# Patient Record
Sex: Male | Born: 2000 | Race: Black or African American | Hispanic: No | Marital: Single | Smoking: Never smoker
Health system: Southern US, Community
[De-identification: ages and names within clinical notes are randomized; demographics above are authoritative.]

## PROBLEM LIST (undated history)

## (undated) DIAGNOSIS — F909 Attention-deficit hyperactivity disorder, unspecified type: Secondary | ICD-10-CM

## (undated) HISTORY — PX: HERNIA REPAIR: SHX51

---
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
## 2020-09-07 ENCOUNTER — Emergency Department (HOSPITAL_COMMUNITY)
Admission: EM | Admit: 2020-09-07 | Discharge: 2020-09-08 | Disposition: A | Discharge status: Home / Self Care | Payer: Medicaid Other | Attending: Emergency Medicine | Admitting: Emergency Medicine

## 2020-09-07 ENCOUNTER — Other Ambulatory Visit: Payer: Self-pay

## 2020-09-07 ENCOUNTER — Encounter (HOSPITAL_COMMUNITY): Payer: Self-pay | Admitting: Emergency Medicine

## 2020-09-07 DIAGNOSIS — M545 Low back pain, unspecified: Secondary | ICD-10-CM | POA: Diagnosis not present

## 2020-09-07 DIAGNOSIS — Z5321 Procedure and treatment not carried out due to patient leaving prior to being seen by health care provider: Secondary | ICD-10-CM | POA: Insufficient documentation

## 2020-09-07 NOTE — ED Triage Notes (Signed)
Patient reports low back pain  For 2 weeks , denies injury /ambulatory , no hematuria or fever .

## 2020-09-08 NOTE — ED Notes (Signed)
Pt left due to not being seen quick enough 

## 2020-09-29 ENCOUNTER — Emergency Department (HOSPITAL_COMMUNITY): Payer: Medicaid Other

## 2020-09-29 ENCOUNTER — Other Ambulatory Visit: Payer: Self-pay

## 2020-09-29 ENCOUNTER — Encounter (HOSPITAL_COMMUNITY): Payer: Self-pay | Admitting: Emergency Medicine

## 2020-09-29 ENCOUNTER — Emergency Department (HOSPITAL_COMMUNITY)
Admission: EM | Admit: 2020-09-29 | Discharge: 2020-09-29 | Disposition: A | Discharge status: Home / Self Care | Payer: Medicaid Other | Attending: Emergency Medicine | Admitting: Emergency Medicine

## 2020-09-29 DIAGNOSIS — R109 Unspecified abdominal pain: Secondary | ICD-10-CM | POA: Insufficient documentation

## 2020-09-29 LAB — URINALYSIS, ROUTINE W REFLEX MICROSCOPIC
Bacteria, UA: NONE SEEN
Bilirubin Urine: NEGATIVE
Glucose, UA: NEGATIVE mg/dL
Hgb urine dipstick: NEGATIVE
Ketones, ur: NEGATIVE mg/dL
Leukocytes,Ua: NEGATIVE
Nitrite: NEGATIVE
Protein, ur: 30 mg/dL — AB
Specific Gravity, Urine: 1.025 (ref 1.005–1.030)
pH: 6 (ref 5.0–8.0)

## 2020-09-29 MED ORDER — NAPROXEN 500 MG PO TABS: 500.0000 mg | ORAL_TABLET | Freq: Two times a day (BID) | ORAL | 0 refills | Status: DC

## 2020-09-29 NOTE — ED Triage Notes (Signed)
Pt reports intermittent L flank pain x 1 month. States that he had COVID about 3 weeks ago and the pain subsided during COVID, but is now returned. Denies pain with urination or hematuria. Denies penile discharge. No fever. Endorses nausea.  Also reports that he was treated for Chlamydia within the last few months.

## 2020-09-29 NOTE — Discharge Instructions (Signed)
Take naproxen 2 times a day with meals.  Do not take other anti-inflammatories at the same time (Advil, Motrin, ibuprofen, Aleve). You may supplement with Tylenol if you need further pain control. Follow up with your primary doctor for further evaluation.  Return to the ER if you develop fevers, severe worsening pain, inability to urinate, or any new, worsening, or concerning symptoms.

## 2020-09-29 NOTE — ED Provider Notes (Signed)
[facility]-EMERGENCY DEPT Provider Note   CSN: [id number] Arrival date & time: 09/29/20  0125     History Chief Complaint  Patient presents with  . Flank Pain    Melvin Lewis is a 20 y.o. male presenting for evaluation of L flank pain.   Pt states he has had L flank pain x1 month. It began gradually and built up in severity. The only time he has been pain free was for 3 days when he had covid. He states pain is worst when he is driving. It occasionally moves to the middle of his back. He is not taking anything for it, nothing makes it better. No associated fever, chills, CP, cough, n/v, urinary sxs, or abnormal BMs. No previous h/o similar. No fall, trauma, or injury. No new strenuous activity. He has no medical problems, takes no medications daily. He states pain is frequently present, but none currently.   HPI     Past Medical History:  Diagnosis Date  . ADHD (attention deficit hyperactivity disorder)     Patient Active Problem List   Diagnosis Date Noted  . Adderall use disorder, mild (HCC) 11/08/2014  . Mood disorder (HCC) 11/08/2014  . Auditory hallucinations   . Visual hallucinations     Past Surgical History:  Procedure Laterality Date  . HERNIA REPAIR         History reviewed. No pertinent family history.  Social History   Tobacco Use  . Smoking status: Never Smoker  . Smokeless tobacco: Never Used  Substance Use Topics  . Alcohol use: No  . Drug use: No    Home Medications Prior to Admission medications   Medication Sig Start Date End Date Taking? Authorizing Provider  naproxen (NAPROSYN) 500 MG tablet Take 1 tablet (500 mg total) by mouth 2 (two) times daily with a meal. 09/29/20  Yes Ruba Outen, PA-C  amphetamine-dextroamphetamine (ADDERALL) 20 MG tablet Take 20 mg by mouth 2 (two) times daily.    [provider]  famotidine (PEPCID) 20 MG tablet Take 1 tablet (20 mg total) by mouth 2 (two) times daily.  07/20/19   Renne Crigler, PA-C    Allergies    Patient has no known allergies.  Review of Systems   Review of Systems  Genitourinary: Positive for flank pain.  All other systems reviewed and are negative.   Physical Exam Updated Vital Signs BP 116/76 (BP Location: Right Arm)   Pulse 73   Temp 98.7 F (37.1 C) (Oral)   Resp 16   SpO2 100%   Physical Exam Vitals and nursing note reviewed.  Constitutional:      General: He is not in acute distress.    Appearance: He is well-developed and well-nourished.     Comments: Resting in the bed in NAD  HENT:     Head: Normocephalic and atraumatic.  Eyes:     Extraocular Movements: Extraocular movements intact and EOM normal.     Conjunctiva/sclera: Conjunctivae normal.     Pupils: Pupils are equal, round, and reactive to light.  Cardiovascular:     Rate and Rhythm: Normal rate and regular rhythm.     Pulses: Normal pulses and intact distal pulses.  Pulmonary:     Effort: Pulmonary effort is normal. No respiratory distress.     Breath sounds: Normal breath sounds. No wheezing.  Abdominal:     General: There is no distension.     Palpations: Abdomen is soft. There is no mass.  Tenderness: There is no abdominal tenderness. There is no right CVA tenderness, left CVA tenderness, guarding or rebound.     Comments: No ttp currently. No ttp of the abd or flank. No cva.   Musculoskeletal:        General: Normal range of motion.     Cervical back: Normal range of motion and neck supple.  Skin:    General: Skin is warm and dry.     Capillary Refill: Capillary refill takes less than 2 seconds.  Neurological:     Mental Status: He is alert and oriented to person, place, and time.  Psychiatric:        Mood and Affect: Mood and affect normal.     ED Results / Procedures / Treatments   Labs (all labs ordered are listed, but only abnormal results are displayed) Labs Reviewed  URINALYSIS, ROUTINE W REFLEX MICROSCOPIC - Abnormal;  Notable for the following components:      Result Value   APPearance HAZY (*)    Protein, ur 30 (*)    All other components within normal limits    EKG None  Radiology CT Renal Stone Study  Result Date: 09/29/2020 CLINICAL DATA:  Left flank and abdomen pain for 1 month. EXAM: CT ABDOMEN AND PELVIS WITHOUT CONTRAST TECHNIQUE: Multidetector CT imaging of the abdomen and pelvis was performed following the standard protocol without IV contrast. COMPARISON:  None. FINDINGS: Lower chest: No significant pulmonary nodules or acute consolidative airspace disease. Hepatobiliary: Normal liver size. No liver mass. Normal gallbladder with no radiopaque cholelithiasis. No biliary ductal dilatation. Pancreas: Normal, with no mass or duct dilation. Spleen: Normal size. No mass. Adrenals/Urinary Tract: Normal adrenals. Faintly calcified nonobstructing 3 mm interpolar left renal stone. No additional renal stones. No hydronephrosis. No contour deforming renal masses. Normal caliber ureters. No ureteral stones. Normal bladder. Stomach/Bowel: Normal non-distended stomach. Normal caliber small bowel with no small bowel wall thickening. Normal retrocecal appendix. Normal large bowel with no diverticulosis, large bowel wall thickening or pericolonic fat stranding. Vascular/Lymphatic: Normal caliber abdominal aorta. No pathologically enlarged lymph nodes in the abdomen or pelvis. Reproductive: Normal size prostate. Other: No pneumoperitoneum, ascites or focal fluid collection. Musculoskeletal: No aggressive appearing focal osseous lesions. IMPRESSION: Faintly calcified nonobstructing 3 mm interpolar left renal stone. No hydronephrosis. No ureteral or bladder stones. Electronically Signed   By: Delbert Phenix M.D.   On: 09/29/2020 07:30    Procedures Procedures (including critical care time)  Medications Ordered in ED Medications - No data to display  ED Course  I have reviewed the triage vital signs and the nursing  notes.  Pertinent labs & imaging results that were available during my care of the patient were reviewed by me and considered in my medical decision making (see chart for details).    MDM Rules/Calculators/A&P                          Pt presenting for evaluation of intermittent L flank pain x1 month. On exam, pt appears nontoxic. No pain currently. Likely MSK as sxs are worse with certain positions and due to chronicity. However as pt has had pain x1 month, will order ct to ensure no obvious kidney abnormality. UA obtained from triage interpreted by me, no UTI. There is mild proteinuria.   CT renal shows a stone in the L kidney. No stone in the ureter. As such, doubt this is cause of pain. however could indicate pt is  passing small stones. Still favor MSK sxs. Will tx with nsaids and have pt f/u with PCP. discussed findings with pt. At this time, pt appears safe for d/c. Return precautions given. Pt states he understands and agrees to plan.   Final Clinical Impression(s) / ED Diagnoses Final diagnoses:  Left flank pain    Rx / DC Orders ED Discharge Orders         Ordered    naproxen (NAPROSYN) 500 MG tablet  2 times daily with meals        09/29/20 0738           Alveria Apley, PA-C 09/29/20 0743    Nira Conn, MD 09/29/20 1815

## 2021-04-30 ENCOUNTER — Encounter (HOSPITAL_BASED_OUTPATIENT_CLINIC_OR_DEPARTMENT_OTHER): Payer: Self-pay | Admitting: Obstetrics and Gynecology

## 2021-04-30 ENCOUNTER — Other Ambulatory Visit: Payer: Self-pay

## 2021-04-30 DIAGNOSIS — Z202 Contact with and (suspected) exposure to infections with a predominantly sexual mode of transmission: Secondary | ICD-10-CM | POA: Diagnosis not present

## 2021-04-30 LAB — URINALYSIS, ROUTINE W REFLEX MICROSCOPIC
Bilirubin Urine: NEGATIVE
Glucose, UA: NEGATIVE mg/dL
Hgb urine dipstick: NEGATIVE
Ketones, ur: NEGATIVE mg/dL
Nitrite: NEGATIVE
Specific Gravity, Urine: 1.024 (ref 1.005–1.030)
pH: 7 (ref 5.0–8.0)

## 2021-04-30 NOTE — ED Triage Notes (Signed)
Patient reports he was exposed to Chlamydia

## 2021-05-01 ENCOUNTER — Emergency Department (HOSPITAL_BASED_OUTPATIENT_CLINIC_OR_DEPARTMENT_OTHER)
Admission: EM | Admit: 2021-05-01 | Discharge: 2021-05-01 | Disposition: A | Discharge status: Home / Self Care | Payer: Medicaid Other | Attending: Emergency Medicine | Admitting: Emergency Medicine

## 2021-05-01 DIAGNOSIS — Z202 Contact with and (suspected) exposure to infections with a predominantly sexual mode of transmission: Secondary | ICD-10-CM

## 2021-05-01 MED ORDER — AZITHROMYCIN 250 MG PO TABS
1000.0000 mg | ORAL_TABLET | Freq: Once | ORAL | Status: AC
  Administered 2021-05-01: 1000 mg via ORAL
  Filled 2021-05-01: qty 4

## 2021-05-01 NOTE — Discharge Instructions (Addendum)
If you are positive then someone should give you a call.  The health department can help you with your STD concerns in the future.

## 2021-05-01 NOTE — ED Provider Notes (Signed)
[facility] EMERGENCY DEPT Provider Note   CSN: [id number] Arrival date & time: 04/30/21  2226     History Chief Complaint  Patient presents with   Exposure to STD    Melvin Lewis is a 20 y.o. male.  20 yo M with a chief complaints of exposure to chlamydia.  He states that he is active with only 1 sexual partner and she told him that she tested positive for chlamydia.  He is somewhat confident that she tested for all the other STDs and they had come back negative.  He is requesting treatment.  He initially denies any symptoms and then later says he has had some mild pelvic discomfort off and on.  No fevers or chills no rashes no penile discharge.  The history is provided by the patient.  Exposure to STD This is a new problem. The current episode started 2 days ago. The problem occurs constantly. The problem has not changed since onset.Pertinent negatives include no chest pain, no abdominal pain, no headaches and no shortness of breath. Nothing aggravates the symptoms. Nothing relieves the symptoms. He has tried nothing for the symptoms. The treatment provided no relief.      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  Social History   Tobacco Use   Smoking status: Never   Smokeless tobacco: Never  Vaping Use   Vaping Use: Never used  Substance Use Topics   Alcohol use: No   Drug use: Yes    Types: Marijuana    Home Medications Prior to Admission medications   Medication Sig Start Date End Date Taking? Authorizing Provider  amphetamine-dextroamphetamine (ADDERALL) 20 MG tablet Take 20 mg by mouth 2 (two)  times daily.    [provider]  famotidine (PEPCID) 20 MG tablet Take 1 tablet (20 mg total) by mouth 2 (two) times daily. 07/20/19   Renne Crigler, PA-C  naproxen (NAPROSYN) 500 MG tablet Take 1 tablet (500 mg total) by mouth 2 (two) times daily with a meal. 09/29/20   Caccavale, Sophia, PA-C    Allergies    Patient has no known allergies.  Review of Systems   Review of Systems  Constitutional:  Negative for chills and fever.  HENT:  Negative for congestion and facial swelling.   Eyes:  Negative for discharge and visual disturbance.  Respiratory:  Negative for shortness of breath.   Cardiovascular:  Negative for chest pain and palpitations.  Gastrointestinal:  Negative for abdominal pain, diarrhea and vomiting.  Genitourinary:  Negative for difficulty urinating, dysuria, flank pain, genital sores, penile discharge, penile pain and penile swelling.  Musculoskeletal:  Negative for arthralgias and myalgias.  Skin:  Negative for color change and rash.  Neurological:  Negative for tremors, syncope and headaches.  Psychiatric/Behavioral:  Negative for confusion and dysphoric mood.    Physical Exam Updated Vital Signs BP 123/79   Pulse 65   Temp 98.1 F (36.7 C)   Resp 12   SpO2 99%   Physical Exam Vitals and nursing note reviewed.  Constitutional:      Appearance: He is  well-developed.  HENT:     Head: Normocephalic and atraumatic.  Eyes:     Pupils: Pupils are equal, round, and reactive to light.  Neck:     Vascular: No JVD.  Cardiovascular:     Rate and Rhythm: Normal rate and regular rhythm.     Heart sounds: No murmur heard.   No friction rub. No gallop.  Pulmonary:     Effort: No respiratory distress.     Breath sounds: No wheezing.  Abdominal:     General: There is no distension.     Tenderness: There is no abdominal tenderness. There is no guarding or rebound.  Musculoskeletal:        General: Normal range of motion.     Cervical back: Normal range of  motion and neck supple.  Skin:    Coloration: Skin is not pale.     Findings: No rash.  Neurological:     Mental Status: He is alert and oriented to person, place, and time.  Psychiatric:        Behavior: Behavior normal.    ED Results / Procedures / Treatments   Labs (all labs ordered are listed, but only abnormal results are displayed) Labs Reviewed  URINALYSIS, ROUTINE W REFLEX MICROSCOPIC - Abnormal; Notable for the following components:      Result Value   Protein, ur TRACE (*)    Leukocytes,Ua SMALL (*)    All other components within normal limits  GC/CHLAMYDIA PROBE AMP ([place]) NOT AT [facility]    EKG None  Radiology No results found.  Procedures Procedures   Medications Ordered in ED Medications  azithromycin (ZITHROMAX) tablet 1,000 mg (1,000 mg Oral Given 05/01/21 0126)    ED Course  I have reviewed the triage vital signs and the nursing notes.  Pertinent labs & imaging results that were available during my care of the patient were reviewed by me and considered in my medical decision making (see chart for details).    MDM Rules/Calculators/A&P                           20 yo M with a chief complaints of exposure to chlamydia.  He is somewhat confident that that is the only thing that his partner tested positive for.  We will give him a one-time dose of azithromycin.  Have him follow-up with his family doctor.  Offered syphilis and HIV testing which he has declined.  1:31 AM:  I have discussed the diagnosis/risks/treatment options with the patient and believe the pt to be eligible for discharge home to follow-up with PCP. We also discussed returning to the ED immediately if new or worsening sx occur. We discussed the sx which are most concerning (e.g., sudden worsening pain, fever, inability to tolerate by mouth) that necessitate immediate return. Medications administered to the patient during their visit and any new prescriptions provided to the patient are  listed below.  Medications given during this visit Medications  azithromycin (ZITHROMAX) tablet 1,000 mg (1,000 mg Oral Given 05/01/21 0126)     The patient appears reasonably screen and/or stabilized for discharge and I doubt any other medical condition or other [facility] requiring further screening, evaluation, or treatment in the ED at this time prior to discharge.   Final Clinical Impression(s) / ED Diagnoses Final diagnoses:  STD exposure    Rx / DC Orders ED Discharge Orders     None  Melene Plan, DO 05/01/21 [phone]

## 2021-05-02 LAB — GC/CHLAMYDIA PROBE AMP (~~LOC~~) NOT AT ARMC
Chlamydia: POSITIVE — AB
Comment: NEGATIVE
Comment: NORMAL
Neisseria Gonorrhea: NEGATIVE

## 2021-06-23 ENCOUNTER — Other Ambulatory Visit: Payer: Self-pay

## 2021-06-23 ENCOUNTER — Emergency Department (HOSPITAL_BASED_OUTPATIENT_CLINIC_OR_DEPARTMENT_OTHER)
Admission: EM | Admit: 2021-06-23 | Discharge: 2021-06-23 | Disposition: A | Discharge status: Home / Self Care | Payer: Medicaid Other | Attending: Emergency Medicine | Admitting: Emergency Medicine

## 2021-06-23 DIAGNOSIS — Z711 Person with feared health complaint in whom no diagnosis is made: Secondary | ICD-10-CM

## 2021-06-23 DIAGNOSIS — Z202 Contact with and (suspected) exposure to infections with a predominantly sexual mode of transmission: Secondary | ICD-10-CM | POA: Diagnosis not present

## 2021-06-23 DIAGNOSIS — N489 Disorder of penis, unspecified: Secondary | ICD-10-CM

## 2021-06-23 DIAGNOSIS — R369 Urethral discharge, unspecified: Secondary | ICD-10-CM | POA: Diagnosis not present

## 2021-06-23 LAB — RAPID HIV SCREEN (HIV 1/2 AB+AG)
HIV 1/2 Antibodies: NONREACTIVE
HIV-1 P24 Antigen - HIV24: NONREACTIVE

## 2021-06-23 MED ORDER — VALACYCLOVIR HCL 1 G PO TABS: 1000.0000 mg | ORAL_TABLET | Freq: Two times a day (BID) | ORAL | 0 refills | Status: DC

## 2021-06-23 MED ORDER — DOXYCYCLINE HYCLATE 100 MG PO TABS
100.0000 mg | ORAL_TABLET | Freq: Once | ORAL | Status: AC
  Administered 2021-06-23: 100 mg via ORAL
  Filled 2021-06-23: qty 1

## 2021-06-23 MED ORDER — LIDOCAINE HCL (PF) 1 % IJ SOLN
INTRAMUSCULAR | Status: AC
  Filled 2021-06-23: qty 5

## 2021-06-23 MED ORDER — DOXYCYCLINE HYCLATE 100 MG PO CAPS: 100.0000 mg | ORAL_CAPSULE | Freq: Two times a day (BID) | ORAL | 0 refills | Status: DC

## 2021-06-23 MED ORDER — CEFTRIAXONE SODIUM 500 MG IJ SOLR
500.0000 mg | Freq: Once | INTRAMUSCULAR | Status: AC
  Administered 2021-06-23: 500 mg via INTRAMUSCULAR
  Filled 2021-06-23: qty 500

## 2021-06-23 NOTE — ED Triage Notes (Addendum)
Pt to ED from home with c/o STD exposure. Pt was seen a month ago for same and states that his symptoms never fully resolved and is still having pain in his pelvis. Pt finished azithromycin around 3 weeks ago.

## 2021-06-23 NOTE — ED Provider Notes (Signed)
[facility] EMERGENCY DEPT Provider Note   CSN: [id number] Arrival date & time: 06/23/21  0120     History Chief Complaint  Patient presents with   Exposure to STD    Melvin Lewis is a 20 y.o. male.  HPI     This is a 20 year old male who presents with concern for STD.  Patient reports that he was seen and evaluated about 3 weeks ago for the same.  He had had no STD exposure.  He was treated.  He states he has had some testicular and penile discomfort.  No dysuria or discharge.  He states that symptoms are similar.  He has noted some "dry skin" on his penis but no obvious lesions.  He reports 2 sexual partners.  He does not use condoms.  I reviewed his chart.  He was seen on 8/18 for concern for STD exposure.  He did test positive for chlamydia at that time.  He was given single dose of azithromycin while in the emergency department.  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  Social History   Tobacco Use   Smoking status: Never   Smokeless tobacco: Never  Vaping Use   Vaping Use: Never used  Substance Use Topics   Alcohol use: No   Drug use: Yes    Types: Marijuana    Home Medications Prior to Admission medications   Medication Sig Start Date End Date Taking? Authorizing Provider  doxycycline (VIBRAMYCIN) 100 MG capsule Take 1 capsule (100 mg total) by mouth 2 (two) times daily. 06/23/21  Yes Shravya Wickwire, Mayer Masker, MD  valACYclovir (VALTREX) 1000 MG tablet Take 1 tablet (1,000 mg total) by mouth 2 (two) times daily. 06/23/21  Yes Neve Branscomb, Mayer Masker, MD  amphetamine-dextroamphetamine (ADDERALL) 20 MG  tablet Take 20 mg by mouth 2 (two) times daily.    [provider]  famotidine (PEPCID) 20 MG tablet Take 1 tablet (20 mg total) by mouth 2 (two) times daily. 07/20/19   Renne Crigler, PA-C  naproxen (NAPROSYN) 500 MG tablet Take 1 tablet (500 mg total) by mouth 2 (two) times daily with a meal. 09/29/20   Caccavale, Sophia, PA-C    Allergies    Patient has no known allergies.  Review of Systems   Review of Systems  Constitutional:  Negative for fever.  Genitourinary:  Positive for penile pain and testicular pain. Negative for dysuria and penile discharge.  All other systems reviewed and are negative.  Physical Exam Updated Vital Signs BP 128/80 (BP Location: Right Arm)   Pulse 78   Temp 98.4 F (36.9 C) (Oral)   Resp 18   Ht 1.93 m (6\' 4" )   Wt 76.2 kg   SpO2 98%   BMI 20.45 kg/m   Physical Exam Vitals and nursing note reviewed.  Constitutional:      Appearance: He is well-developed. He is not ill-appearing.  HENT:     Head: Normocephalic and atraumatic.     Mouth/Throat:     Mouth: Mucous membranes are moist.  Eyes:     Pupils: Pupils are equal,  round, and reactive to light.  Cardiovascular:     Rate and Rhythm: Normal rate and regular rhythm.  Pulmonary:     Effort: Pulmonary effort is normal. No respiratory distress.  Abdominal:     Palpations: Abdomen is soft.     Tenderness: There is no abdominal tenderness.  Genitourinary:    Comments: Circumcised penis, small 2 mm ulceration just proximal to the glans of the penis, no adjacent erythema, bilateral distended testicles, nontender Musculoskeletal:     Cervical back: Neck supple.  Skin:    General: Skin is warm and dry.  Neurological:     Mental Status: He is alert and oriented to person, place, and time.  Psychiatric:        Mood and Affect: Mood normal.    ED Results / Procedures / Treatments   Labs (all labs ordered are listed, but only abnormal results are displayed) Labs Reviewed  HSV CULTURE  AND TYPING  RAPID HIV SCREEN (HIV 1/2 AB+AG)  RPR  GC/CHLAMYDIA PROBE AMP ([place]) NOT AT [facility]    EKG None  Radiology No results found.  Procedures Procedures   Medications Ordered in ED Medications  cefTRIAXone (ROCEPHIN) injection 500 mg (has no administration in time range)  doxycycline (VIBRA-TABS) tablet 100 mg (has no administration in time range)    ED Course  I have reviewed the triage vital signs and the nursing notes.  Pertinent labs & imaging results that were available during my care of the patient were reviewed by me and considered in my medical decision making (see chart for details).    MDM Rules/Calculators/A&P                           Patient presents with concern for STDs.  Persistent penile discomfort.  He is nontoxic and vital signs are reassuring.  He did test positive for chlamydia in late August.  He received single dose azithromycin.  There is no known resistance in our community.  We will repeat testing.  Given penile lesion, would also recommend HIV and syphilis testing.  Penile lesion is most concerning for genital herpes.  Less likely chancre or syphilis.  Culture was sent.  We will treat with Valtrex.  Patient was given IM Rocephin and doxycycline.  Will discharge with course of doxycycline.  We discussed safe sex practices.  Patient does not consistently use condoms.  We discussed abstaining from sexual activity until lesion is fully cleared and at least 10 days.  After history, exam, and medical workup I feel the patient has been appropriately medically screened and is safe for discharge home. Pertinent diagnoses were discussed with the patient. Patient was given return precautions.  Final Clinical Impression(s) / ED Diagnoses Final diagnoses:  Concern about STD in male without diagnosis  Penile lesion    Rx / DC Orders ED Discharge Orders          Ordered    valACYclovir (VALTREX) 1000 MG tablet  2 times daily        06/23/21 0159     doxycycline (VIBRAMYCIN) 100 MG capsule  2 times daily        06/23/21 0159             Tykia Mellone, Mayer Masker, MD 06/23/21 [phone]

## 2021-06-23 NOTE — Discharge Instructions (Addendum)
You were seen today for concerns for STDs.  Repeat chlamydia and gonorrhea testing are pending as well as syphilis and HIV.  Take medications as prescribed.  You have a lesion on your penis that is concerning for genital herpes.  Take Valtrex for the next 10 days.  Abstain from sexual activity for at least 10 days and until this lesion has fully healed.  You should always use condoms.

## 2021-06-24 LAB — GC/CHLAMYDIA PROBE AMP (~~LOC~~) NOT AT ARMC
Chlamydia: NEGATIVE
Comment: NEGATIVE
Comment: NORMAL
Neisseria Gonorrhea: NEGATIVE

## 2021-06-24 LAB — RPR: RPR Ser Ql: NONREACTIVE

## 2021-06-25 LAB — HSV CULTURE AND TYPING

## 2021-07-31 ENCOUNTER — Other Ambulatory Visit: Payer: Self-pay

## 2021-07-31 ENCOUNTER — Emergency Department (HOSPITAL_BASED_OUTPATIENT_CLINIC_OR_DEPARTMENT_OTHER)
Admission: EM | Admit: 2021-07-31 | Discharge: 2021-08-01 | Disposition: A | Discharge status: Home / Self Care | Payer: Medicaid Other | Attending: Emergency Medicine | Admitting: Emergency Medicine

## 2021-07-31 ENCOUNTER — Encounter (HOSPITAL_BASED_OUTPATIENT_CLINIC_OR_DEPARTMENT_OTHER): Payer: Self-pay

## 2021-07-31 DIAGNOSIS — R809 Proteinuria, unspecified: Secondary | ICD-10-CM | POA: Diagnosis not present

## 2021-07-31 DIAGNOSIS — R102 Pelvic and perineal pain: Secondary | ICD-10-CM | POA: Diagnosis not present

## 2021-07-31 DIAGNOSIS — R1031 Right lower quadrant pain: Secondary | ICD-10-CM | POA: Diagnosis present

## 2021-07-31 LAB — URINALYSIS, ROUTINE W REFLEX MICROSCOPIC
Bilirubin Urine: NEGATIVE
Glucose, UA: NEGATIVE mg/dL
Hgb urine dipstick: NEGATIVE
Ketones, ur: NEGATIVE mg/dL
Leukocytes,Ua: NEGATIVE
Nitrite: NEGATIVE
Specific Gravity, Urine: 1.027 (ref 1.005–1.030)
pH: 7 (ref 5.0–8.0)

## 2021-07-31 NOTE — ED Triage Notes (Signed)
Patient here POV from Home with STD Exposure.  Patient states he has not been able to obtain his Test Results and still has irritation with Genital Area.  No Fevers. A&Ox4. GCS 15. NAD Noted during Triage. Ambulatory.

## 2021-08-01 NOTE — ED Provider Notes (Signed)
[facility] EMERGENCY DEPT Provider Note   CSN: [id number] Arrival date & time: 07/31/21  2058     History Chief Complaint  Patient presents with   SEXUALLY TRANSMITTED DISEASE    Melvin Lewis is a 20 y.o. male.   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  Social History   Tobacco Use   Smoking status: Never   Smokeless tobacco: Never  Vaping Use   Vaping Use: Never used  Substance Use Topics   Alcohol use: No   Drug use: Yes    Types: Marijuana    Home Medications Prior to Admission medications   Medication Sig Start Date End Date Taking? Authorizing Provider  amphetamine-dextroamphetamine (ADDERALL) 20 MG tablet Take 20 mg by mouth 2 (two) times daily.    [provider]  doxycycline (VIBRAMYCIN) 100 MG capsule Take 1 capsule (100 mg total) by mouth 2 (two) times daily. 06/23/21   Horton, Mayer Masker, MD  famotidine (PEPCID) 20 MG tablet Take 1 tablet (20 mg total) by mouth 2 (two) times daily. 07/20/19   Renne Crigler, PA-C  naproxen (NAPROSYN) 500 MG tablet Take 1 tablet (500 mg total) by mouth 2 (two) times daily with a meal. 09/29/20   Caccavale, Sophia, PA-C   valACYclovir (VALTREX) 1000 MG tablet Take 1 tablet (1,000 mg total) by mouth 2 (two) times daily. 06/23/21   Horton, Mayer Masker, MD    Allergies    Patient has no allergy information on record.  Review of Systems   Review of Systems  Gastrointestinal:  Positive for abdominal pain. Negative for anorexia.  Genitourinary:  Negative for dysuria.  All other systems reviewed and are negative.  Physical Exam Updated Vital Signs BP 130/65 (BP Location: Right Arm)   Pulse 63   Temp 98.1 F (36.7 C) (Oral)   Resp 18   Ht 6\' 4"  (1.93 m)   Wt 76.2 kg   SpO2 97%   BMI 20.45 kg/m   Physical Exam Vitals and nursing note reviewed.  Constitutional:      Appearance: He is well-developed.  HENT:     Head: Normocephalic and atraumatic.     Nose: No congestion or rhinorrhea.     Mouth/Throat:     Mouth: Mucous membranes are moist.     Pharynx: Oropharynx is clear.  Eyes:     Pupils: Pupils are equal, round, and reactive to light.  Cardiovascular:     Rate and Rhythm: Normal rate.  Pulmonary:     Effort: Pulmonary effort is normal. No respiratory distress.  Abdominal:     General: Abdomen is flat. There is  no distension.     Palpations: There is no mass.     Tenderness: There is no abdominal tenderness.     Hernia: No hernia is present.  Musculoskeletal:        General: No swelling or tenderness. Normal range of motion.     Cervical back: Normal range of motion.  Skin:    General: Skin is warm and dry.  Neurological:     General: No focal deficit present.     Mental Status: He is alert.    ED Results / Procedures / Treatments   Labs (all labs ordered are listed, but only abnormal results are displayed) Labs Reviewed  URINALYSIS, ROUTINE W REFLEX MICROSCOPIC - Abnormal; Notable for the following components:      Result Value   Protein, ur TRACE (*)    All other components within normal limits    EKG None  Radiology No results found.  Procedures Procedures    Medications Ordered in ED Medications - No data to display  ED Course  I have reviewed the triage vital signs and the nursing notes.  Pertinent labs & imaging results that were available during my care of the patient were reviewed by me and considered in my medical decision making (see chart for details).    MDM Rules/Calculators/A&P                         Seen for same symptoms a couple weeks ago with neg rpr/hiv/gc/HSV. Symptoms haven't changed. Possibly intermittent inguinal hernia? No pain now. It is not severe when he has it. No urinary changes. No GI changes.   Final Clinical Impression(s) / ED Diagnoses Final diagnoses:  Pelvic pain  Proteinuria, unspecified type    Rx / DC Orders ED Discharge Orders     None        Veverly Larimer, Corene Cornea, MD 08/01/21 0202

## 2022-09-28 ENCOUNTER — Other Ambulatory Visit: Payer: Self-pay

## 2022-09-28 ENCOUNTER — Emergency Department (HOSPITAL_BASED_OUTPATIENT_CLINIC_OR_DEPARTMENT_OTHER)
Admission: EM | Admit: 2022-09-28 | Discharge: 2022-09-28 | Disposition: A | Discharge status: Home / Self Care | Payer: Medicaid Other | Attending: Emergency Medicine | Admitting: Emergency Medicine

## 2022-09-28 ENCOUNTER — Encounter (HOSPITAL_BASED_OUTPATIENT_CLINIC_OR_DEPARTMENT_OTHER): Payer: Self-pay | Admitting: Emergency Medicine

## 2022-09-28 DIAGNOSIS — R197 Diarrhea, unspecified: Secondary | ICD-10-CM | POA: Insufficient documentation

## 2022-09-28 DIAGNOSIS — R109 Unspecified abdominal pain: Secondary | ICD-10-CM | POA: Diagnosis not present

## 2022-09-28 DIAGNOSIS — R112 Nausea with vomiting, unspecified: Secondary | ICD-10-CM | POA: Insufficient documentation

## 2022-09-28 LAB — CBC WITH DIFFERENTIAL/PLATELET
Abs Immature Granulocytes: 0.02 10*3/uL (ref 0.00–0.07)
Basophils Absolute: 0 10*3/uL (ref 0.0–0.1)
Basophils Relative: 0 %
Eosinophils Absolute: 0 10*3/uL (ref 0.0–0.5)
Eosinophils Relative: 0 %
HCT: 49.5 % (ref 39.0–52.0)
Hemoglobin: 17 g/dL (ref 13.0–17.0)
Immature Granulocytes: 0 %
Lymphocytes Relative: 8 %
Lymphs Abs: 0.5 10*3/uL — ABNORMAL LOW (ref 0.7–4.0)
MCH: 28.6 pg (ref 26.0–34.0)
MCHC: 34.3 g/dL (ref 30.0–36.0)
MCV: 83.3 fL (ref 80.0–100.0)
Monocytes Absolute: 0.5 10*3/uL (ref 0.1–1.0)
Monocytes Relative: 7 %
Neutro Abs: 5.5 10*3/uL (ref 1.7–7.7)
Neutrophils Relative %: 85 %
Platelets: 182 10*3/uL (ref 150–400)
RBC: 5.94 MIL/uL — ABNORMAL HIGH (ref 4.22–5.81)
RDW: 12.6 % (ref 11.5–15.5)
WBC: 6.5 10*3/uL (ref 4.0–10.5)
nRBC: 0 % (ref 0.0–0.2)

## 2022-09-28 LAB — COMPREHENSIVE METABOLIC PANEL
ALT: 32 U/L (ref 0–44)
AST: 24 U/L (ref 15–41)
Albumin: 4.8 g/dL (ref 3.5–5.0)
Alkaline Phosphatase: 65 U/L (ref 38–126)
Anion gap: 7 (ref 5–15)
BUN: 14 mg/dL (ref 6–20)
CO2: 29 mmol/L (ref 22–32)
Calcium: 10 mg/dL (ref 8.9–10.3)
Chloride: 102 mmol/L (ref 98–111)
Creatinine, Ser: 0.94 mg/dL (ref 0.61–1.24)
GFR, Estimated: >60 mL/min (ref >60)
Glucose, Bld: 109 mg/dL — ABNORMAL HIGH (ref 70–99)
Potassium: 3.9 mmol/L (ref 3.5–5.1)
Sodium: 138 mmol/L (ref 135–145)
Total Bilirubin: 3.3 mg/dL — ABNORMAL HIGH (ref 0.3–1.2)
Total Protein: 7.4 g/dL (ref 6.5–8.1)

## 2022-09-28 LAB — LIPASE, BLOOD: Lipase: <10 U/L — ABNORMAL LOW (ref 11–51)

## 2022-09-28 MED ORDER — ONDANSETRON 4 MG PO TBDP: ORAL_TABLET | ORAL | 0 refills | Status: DC

## 2022-09-28 MED ORDER — LOPERAMIDE HCL 2 MG PO CAPS: 2.0000 mg | ORAL_CAPSULE | Freq: Four times a day (QID) | ORAL | 0 refills | Status: DC | PRN

## 2022-09-28 MED ORDER — DOXYCYCLINE HYCLATE 100 MG PO CAPS: 100.0000 mg | ORAL_CAPSULE | Freq: Two times a day (BID) | ORAL | 0 refills | Status: AC

## 2022-09-28 MED ORDER — LIDOCAINE HCL (PF) 1 % IJ SOLN
1.0000 mL | Freq: Once | INTRAMUSCULAR | Status: AC
  Administered 2022-09-28: 2.1 mL
  Filled 2022-09-28: qty 5

## 2022-09-28 MED ORDER — ONDANSETRON HCL 4 MG/2ML IJ SOLN
4.0000 mg | Freq: Once | INTRAMUSCULAR | Status: AC
  Administered 2022-09-28: 4 mg via INTRAVENOUS
  Filled 2022-09-28: qty 2

## 2022-09-28 MED ORDER — SODIUM CHLORIDE 0.9 % IV BOLUS
1000.0000 mL | Freq: Once | INTRAVENOUS | Status: AC
  Administered 2022-09-28: 1000 mL via INTRAVENOUS

## 2022-09-28 MED ORDER — OXYCODONE HCL 5 MG PO TABS
5.0000 mg | ORAL_TABLET | Freq: Once | ORAL | Status: AC
  Administered 2022-09-28: 5 mg via ORAL
  Filled 2022-09-28: qty 1

## 2022-09-28 MED ORDER — DOXYCYCLINE HYCLATE 100 MG PO TABS
100.0000 mg | ORAL_TABLET | Freq: Once | ORAL | Status: AC
  Administered 2022-09-28: 100 mg via ORAL
  Filled 2022-09-28: qty 1

## 2022-09-28 MED ORDER — CEFTRIAXONE SODIUM 500 MG IJ SOLR
500.0000 mg | Freq: Once | INTRAMUSCULAR | Status: AC
  Administered 2022-09-28: 500 mg via INTRAMUSCULAR
  Filled 2022-09-28: qty 500

## 2022-09-28 MED ORDER — ONDANSETRON HCL 4 MG PO TABS: 4.0000 mg | ORAL_TABLET | Freq: Four times a day (QID) | ORAL | 0 refills | Status: DC | PRN

## 2022-09-28 NOTE — ED Triage Notes (Signed)
Pt arrives to ED via [facility] with c/o abdominal pain, diarrhea, nausea, vomiting that started at 4am last night. Pt notes smoking weed prior to symptoms.

## 2022-09-28 NOTE — ED Provider Notes (Signed)
3:10 PM Patient signed out to me by previous ED physician. Pt is a 22 yo male presenting for n/v/diarrhea that started today.   Physical Exam  BP (!) 159/92 (BP Location: Right Arm)   Pulse 74   Temp 97.6 F (36.4 C)   Resp 16   Ht 6\' 4"  (1.93 m)   Wt 74.8 kg   SpO2 100%   BMI 20.08 kg/m   Physical Exam  Procedures  Procedures  ED Course / MDM    Medical Decision Making Amount and/or Complexity of Data Reviewed Labs: ordered.  Risk Prescription drug management.   Patient is resting comfortably and sleeping in bed.  Easily arousable.  Admits to resolution of nausea and vomiting.  Also expresses concerns for unprotected sexual contact with a partner who recently tested positive for chlamydia.  Urine analysis for gonorrhea and chlamydia sent.  Patient requesting treatment for gonorrhea and chlamydia at this time.  Rocephin injection and doxycycline given in ED doxycycline prescribed to pharmacy.  Agrees to follow MyChart for results and understands it will take 24 to 48 hours.  He agrees to follow-up with his primary care physician for any positive results for a negative test before resuming sexual activity.  Patient's other symptoms likely secondary to gastroenteritis.  Zofran and Imodium prescribed.  Patient in no distress and overall condition improved here in the ED. Detailed discussions were had with the patient regarding current findings, and need for close f/u with PCP or on call doctor. The patient has been instructed to return immediately if the symptoms worsen in any way for re-evaluation. Patient verbalized understanding and is in agreement with current care plan. All questions answered prior to discharge.          Campbell Stall P, DO 95/09/32 1755

## 2022-09-28 NOTE — Discharge Instructions (Addendum)
He can take Imodium for diarrhea.  This is available over-the-counter.  Please return for worsening pain inability to eat or drink.  Follow your MyChart account for results on STD results and follow up with primary care if positive for negative test before resuming sexual activity.

## 2022-09-28 NOTE — ED Provider Notes (Signed)
[place] EMERGENCY DEPT Provider Note   CSN: [id number] Arrival date & time: 09/28/22  1418     History  Chief Complaint  Patient presents with   Abdominal Pain   Emesis    Melvin Lewis is a 22 y.o. male.  22 yo M with a chief complaints of nausea vomiting and diarrhea.  This started this morning.  Diffuse crampy abdominal discomfort.  No known sick contacts.  Had [employer] prior to the event.  Denies blood in his stool or emesis.   Abdominal Pain Associated symptoms: vomiting   Emesis Associated symptoms: abdominal pain        Home Medications Prior to Admission medications   Medication Sig Start Date End Date Taking? Authorizing Provider  ondansetron (ZOFRAN-ODT) 4 MG disintegrating tablet 4mg  ODT q4 hours prn nausea/vomit 09/28/22  Yes Deno Etienne, DO  amphetamine-dextroamphetamine (ADDERALL) 20 MG tablet Take 20 mg by mouth 2 (two) times daily.    [provider]  doxycycline (VIBRAMYCIN) 100 MG capsule Take 1 capsule (100 mg total) by mouth 2 (two) times daily. 06/23/21   Horton, Barbette Hair, MD  famotidine (PEPCID) 20 MG tablet Take 1 tablet (20 mg total) by mouth 2 (two) times daily. 07/20/19   Carlisle Cater, PA-C  naproxen (NAPROSYN) 500 MG tablet Take 1 tablet (500 mg total) by mouth 2 (two) times daily with a meal. 09/29/20   Caccavale, Sophia, PA-C  valACYclovir (VALTREX) 1000 MG tablet Take 1 tablet (1,000 mg total) by mouth 2 (two) times daily. 06/23/21   Horton, Barbette Hair, MD      Allergies    Patient has no known allergies.    Review of Systems   Review of Systems  Gastrointestinal:  Positive for abdominal pain and vomiting.    Physical Exam Updated Vital Signs BP (!) 159/92 (BP Location: Right Arm)   Pulse 74   Temp 97.6 F (36.4 C)   Resp 16   Ht 6\' 4"  (1.93 m)   Wt 74.8 kg   SpO2 100%   BMI 20.08 kg/m  Physical Exam Vitals and nursing note reviewed.  Constitutional:      Appearance: He is well-developed.   HENT:     Head: Normocephalic and atraumatic.  Eyes:     Pupils: Pupils are equal, round, and reactive to light.  Neck:     Vascular: No JVD.  Cardiovascular:     Rate and Rhythm: Normal rate and regular rhythm.     Heart sounds: No murmur heard.    No friction rub. No gallop.  Pulmonary:     Effort: No respiratory distress.     Breath sounds: No wheezing.  Abdominal:     General: There is no distension.     Tenderness: There is no abdominal tenderness. There is no guarding or rebound.  Musculoskeletal:        General: Normal range of motion.     Cervical back: Normal range of motion and neck supple.  Skin:    Coloration: Skin is not pale.     Findings: No rash.  Neurological:     Mental Status: He is alert and oriented to person, place, and time.  Psychiatric:        Behavior: Behavior normal.     ED Results / Procedures / Treatments   Labs (all labs ordered are listed, but only abnormal results are displayed) Labs Reviewed  CBC WITH DIFFERENTIAL/PLATELET - Abnormal; Notable for the following components:  Result Value   RBC 5.94 (*)    Lymphs Abs 0.5 (*)    All other components within normal limits  COMPREHENSIVE METABOLIC PANEL  LIPASE, BLOOD    EKG None  Radiology No results found.  Procedures Procedures    Medications Ordered in ED Medications  sodium chloride 0.9 % bolus 1,000 mL (1,000 mLs Intravenous New Bag/Given 09/28/22 1444)  ondansetron (ZOFRAN) injection 4 mg (4 mg Intravenous Given 09/28/22 1444)  oxyCODONE (Oxy IR/ROXICODONE) immediate release tablet 5 mg (5 mg Oral Given 09/28/22 1438)    ED Course/ Medical Decision Making/ A&P                             Medical Decision Making Amount and/or Complexity of Data Reviewed Labs: ordered.  Risk Prescription drug management.   22 yo M with a chief complaints of nausea vomiting and diarrhea.  This started this morning.  Diffuse crampy abdominal pain.  Will obtain a laboratory  evaluation bolus of IV fluids. Oral trial.  Reassess.  Patient Signed out to Dr. Pearline Cables.  Awaiting lab work.  3:20 PM:  I have discussed the diagnosis/risks/treatment options with the patient.  Evaluation and diagnostic testing in the emergency department does not suggest an emergent condition requiring admission or immediate intervention beyond what has been performed at this time.  They will follow up with PCP. We also discussed returning to the ED immediately if new or worsening sx occur. We discussed the sx which are most concerning (e.g., sudden worsening pain, fever, inability to tolerate by mouth) that necessitate immediate return. Medications administered to the patient during their visit and any new prescriptions provided to the patient are listed below.  Medications given during this visit Medications  sodium chloride 0.9 % bolus 1,000 mL (1,000 mLs Intravenous New Bag/Given 09/28/22 1444)  ondansetron (ZOFRAN) injection 4 mg (4 mg Intravenous Given 09/28/22 1444)  oxyCODONE (Oxy IR/ROXICODONE) immediate release tablet 5 mg (5 mg Oral Given 09/28/22 1438)     The patient appears reasonably screen and/or stabilized for discharge and I doubt any other medical condition or other [facility] requiring further screening, evaluation, or treatment in the ED at this time prior to discharge.          Final Clinical Impression(s) / ED Diagnoses Final diagnoses:  Nausea vomiting and diarrhea    Rx / DC Orders ED Discharge Orders          Ordered    ondansetron (ZOFRAN-ODT) 4 MG disintegrating tablet        09/28/22 [place], Anes Rigel, DO 09/28/22 1520

## 2022-09-29 LAB — GC/CHLAMYDIA PROBE AMP (~~LOC~~) NOT AT ARMC
Chlamydia: POSITIVE — AB
Comment: NEGATIVE
Comment: NORMAL
Neisseria Gonorrhea: NEGATIVE

## 2022-12-09 ENCOUNTER — Emergency Department (HOSPITAL_BASED_OUTPATIENT_CLINIC_OR_DEPARTMENT_OTHER)
Admission: EM | Admit: 2022-12-09 | Discharge: 2022-12-09 | Disposition: A | Discharge status: Home / Self Care | Payer: Medicaid Other | Attending: Emergency Medicine | Admitting: Emergency Medicine

## 2022-12-09 ENCOUNTER — Encounter (HOSPITAL_BASED_OUTPATIENT_CLINIC_OR_DEPARTMENT_OTHER): Payer: Self-pay

## 2022-12-09 ENCOUNTER — Other Ambulatory Visit: Payer: Self-pay

## 2022-12-09 DIAGNOSIS — Z202 Contact with and (suspected) exposure to infections with a predominantly sexual mode of transmission: Secondary | ICD-10-CM | POA: Insufficient documentation

## 2022-12-09 DIAGNOSIS — R1030 Lower abdominal pain, unspecified: Secondary | ICD-10-CM | POA: Insufficient documentation

## 2022-12-09 LAB — URINALYSIS, ROUTINE W REFLEX MICROSCOPIC
Bilirubin Urine: NEGATIVE
Glucose, UA: NEGATIVE mg/dL
Hgb urine dipstick: NEGATIVE
Ketones, ur: NEGATIVE mg/dL
Leukocytes,Ua: NEGATIVE
Nitrite: NEGATIVE
Protein, ur: NEGATIVE mg/dL
Specific Gravity, Urine: 1.02 (ref 1.005–1.030)
pH: 7.5 (ref 5.0–8.0)

## 2022-12-09 MED ORDER — CEFTRIAXONE SODIUM 500 MG IJ SOLR
500.0000 mg | Freq: Once | INTRAMUSCULAR | Status: AC
  Administered 2022-12-09: 500 mg via INTRAMUSCULAR
  Filled 2022-12-09: qty 500

## 2022-12-09 MED ORDER — DOXYCYCLINE HYCLATE 100 MG PO CAPS: 100.0000 mg | ORAL_CAPSULE | Freq: Two times a day (BID) | ORAL | 0 refills | Status: AC

## 2022-12-09 NOTE — ED Triage Notes (Signed)
Sexual partner tested positive for chlamydia. Mild suprapubic discomfort.   Request eval for medications.

## 2022-12-09 NOTE — ED Provider Notes (Signed)
[facility] Emergency Department Provider Note MRN:  [id number]  Arrival date & time: 12/09/22     Chief Complaint   Exposure to STD   History of Present Illness   Melvin Lewis is a 22 y.o. year-old male with no pertinent medical history presenting to the ED with chief complaint of exposure to STD.  Sexual partner recently tested positive for chlamydia.  Patient having some lower suprapubic discomfort.  Denies rash or testicular pain or swelling, no penile discharge, no fever, no other complaints.  Wants a prescription for treatment.  Review of Systems  A thorough review of systems was obtained and all systems are negative except as noted in the HPI and PMH.   Patient's Health History    Past Medical History:  Diagnosis Date   ADHD (attention deficit hyperactivity disorder)     Past Surgical History:  Procedure Laterality Date   HERNIA REPAIR      History reviewed. No pertinent family history.  Social History   Socioeconomic History   Marital status: Single    Spouse name: Not on file   Number of children: Not on file   Years of education: Not on file   Highest education level: Not on file  Occupational History   Not on file  Tobacco Use   Smoking status: Never   Smokeless tobacco: Never  Vaping Use   Vaping Use: Never used  Substance and Sexual Activity   Alcohol use: No   Drug use: Yes    Types: Marijuana   Sexual activity: Yes  Other Topics Concern   Not on file  Social History Narrative   Not on file   Social Determinants of Health   Financial Resource Strain: Not on file  Food Insecurity: Not on file  Transportation Needs: Not on file  Physical Activity: Not on file  Stress: Not on file  Social Connections: Not on file  Intimate Partner Violence: Not on file     Physical Exam   Vitals:   12/09/22 0011  BP: 118/84  Pulse: 89  Resp: 15  Temp: 98.6 F (37 C)  SpO2: 98%    CONSTITUTIONAL: Well-appearing,  NAD NEURO/PSYCH:  Alert and oriented x 3, no focal deficits EYES:  eyes equal and reactive ENT/NECK:  no LAD, no JVD CARDIO: Regular rate, well-perfused, normal S1 and S2 PULM:  CTAB no wheezing or rhonchi GI/GU:  non-distended, non-tender; genital exam deferred by patient MSK/SPINE:  No gross deformities, no edema SKIN:  no rash, atraumatic   *Additional and/or pertinent findings included in MDM below  Diagnostic and Interventional Summary    EKG Interpretation  Date/Time:    Ventricular Rate:    PR Interval:    QRS Duration:   QT Interval:    QTC Calculation:   R Axis:     Text Interpretation:         Labs Reviewed  URINALYSIS, ROUTINE W REFLEX MICROSCOPIC  GC/CHLAMYDIA PROBE AMP ([place]) NOT AT [facility]    No orders to display    Medications  cefTRIAXone (ROCEPHIN) injection 500 mg (500 mg Intramuscular Given 12/09/22 0029)     Procedures  /  Critical Care Procedures  ED Course and Medical Decision Making  Initial Impression and Ddx Suspicion for chlamydia based on history.  Will send urine for GC chlamydia testing, will empirically treat.  Abdomen soft and nontender, no abnormal vital signs.  UTI is another consideration but felt to be less likely.  Past medical/surgical  history that increases complexity of ED encounter: None  Interpretation of Diagnostics I personally reviewed the urinalysis and my interpretation is as follows: Doubt UTI    Patient Reassessment and Ultimate Disposition/Management     Discharge  Patient management required discussion with the following services or consulting groups:  None  Complexity of Problems Addressed Acute complicated illness or Injury  Additional Data Reviewed and Analyzed Further history obtained from: Prior ED visit notes  Additional Factors Impacting ED Encounter Risk Prescriptions  Barth Kirks. Sedonia Small, [place] [email or web address]  Final Clinical  Impressions(s) / ED Diagnoses     ICD-10-CM   1. Exposure to STD  Z20.2       ED Discharge Orders          Ordered    doxycycline (VIBRAMYCIN) 100 MG capsule  2 times daily        12/09/22 0033             Discharge Instructions Discussed with and Provided to Patient:     Discharge Instructions      You were evaluated in the Emergency Department and after careful evaluation, we did not find any emergent condition requiring admission or further testing in the hospital.  Your exam/testing today is overall reassuring.  Take the doxycycline as prescribed for 2 weeks.  Avoid sexual contact during this time.  Please return to the Emergency Department if you experience any worsening of your condition.   Thank you for allowing [place] to be a part of your care.       Maudie Flakes, MD 12/09/22 [phone]

## 2022-12-09 NOTE — Discharge Instructions (Addendum)
You were evaluated in the Emergency Department and after careful evaluation, we did not find any emergent condition requiring admission or further testing in the hospital.  Your exam/testing today is overall reassuring.  Take the doxycycline as prescribed for 2 weeks.  Avoid sexual contact during this time.  Please return to the Emergency Department if you experience any worsening of your condition.   Thank you for allowing [place] to be a part of your care.

## 2022-12-10 LAB — GC/CHLAMYDIA PROBE AMP (~~LOC~~) NOT AT ARMC
Chlamydia: NEGATIVE
Comment: NEGATIVE
Comment: NORMAL
Neisseria Gonorrhea: NEGATIVE

## 2022-12-15 IMAGING — CT CT RENAL STONE PROTOCOL
2 of 4 series · 16 of 46 positions shown, 18 images · non-contrast
Comparison: None.

CLINICAL DATA: Left flank and abdomen pain for 1 month.

EXAM:
CT ABDOMEN AND PELVIS WITHOUT CONTRAST
TECHNIQUE: Multidetector CT imaging of the abdomen and pelvis was performed
following the standard protocol without IV contrast.

[Series 2: axial st · axial · 0.63mm/px · z∈[-488,-98]mm · 13 of 88 slices shown, 15 images]
[im 5/88  soft-tissue]
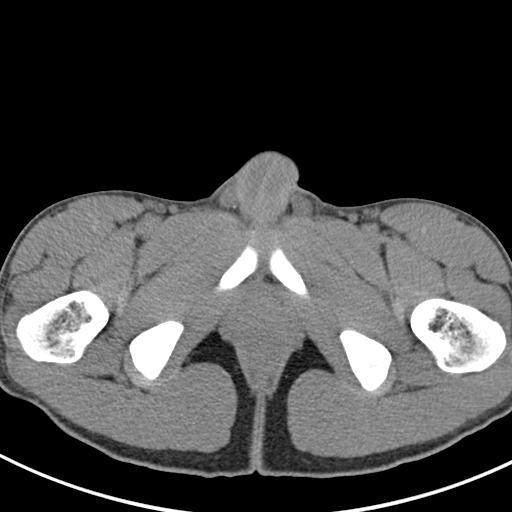
[im 5/88  bone]
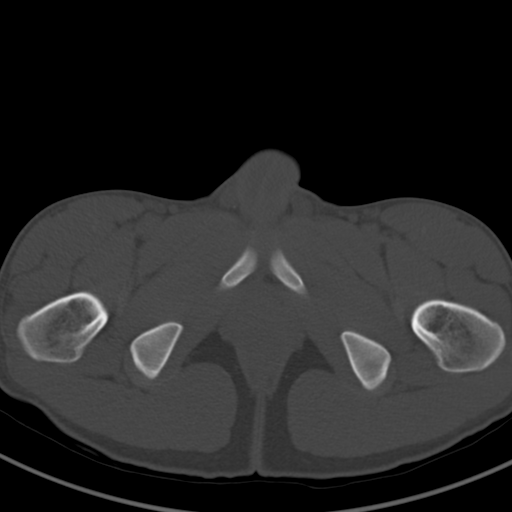
[im 10/88  soft-tissue]
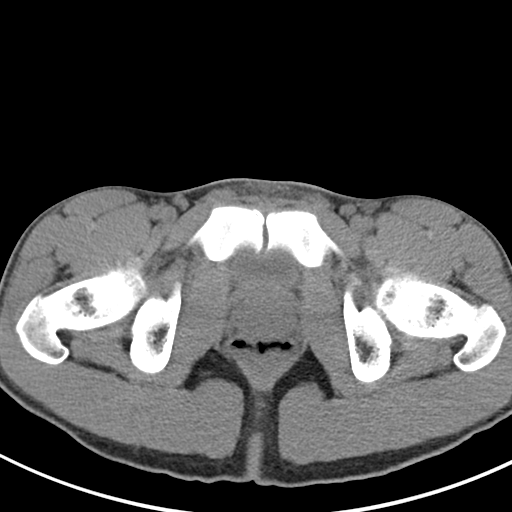
[im 20/88  soft-tissue]
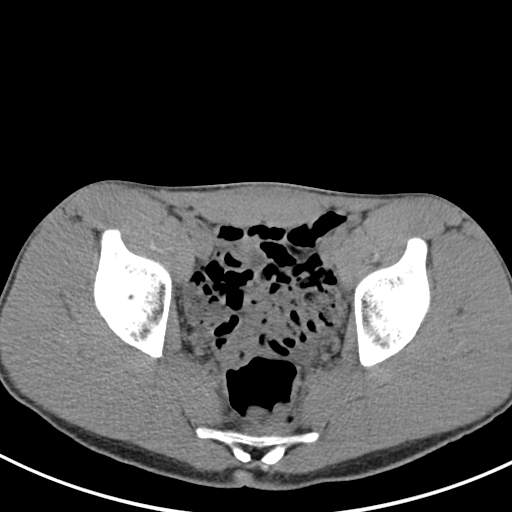
[im 25/88  soft-tissue]
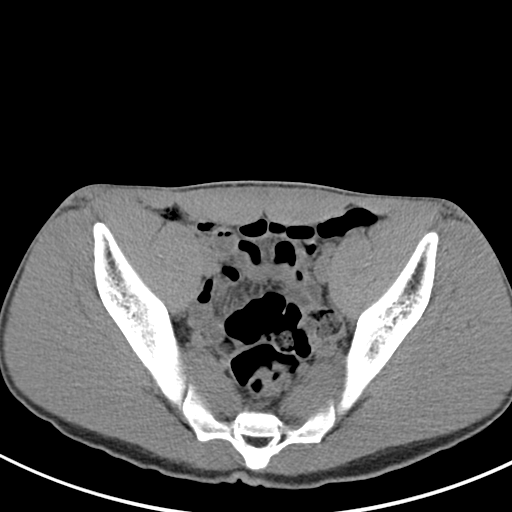
[im 30/88  soft-tissue]
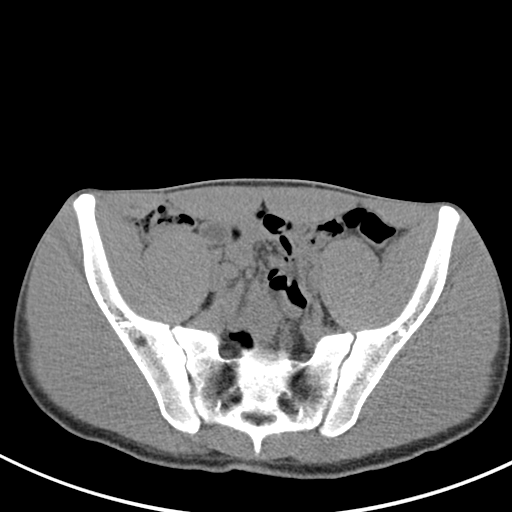
[im 39/88  soft-tissue]
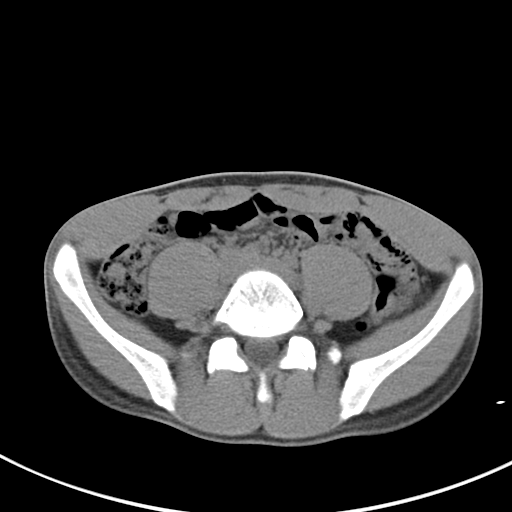
[im 44/88  soft-tissue]
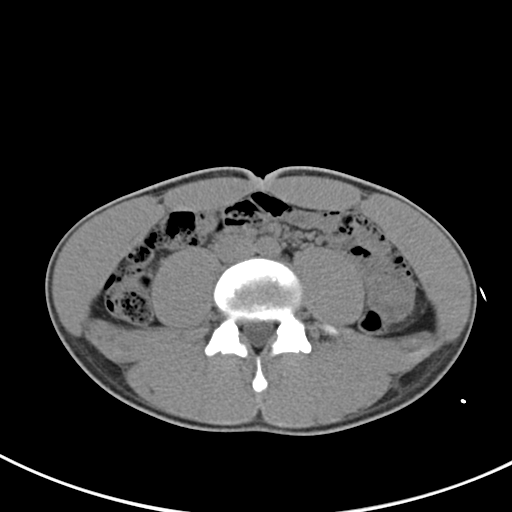
[im 49/88  soft-tissue]
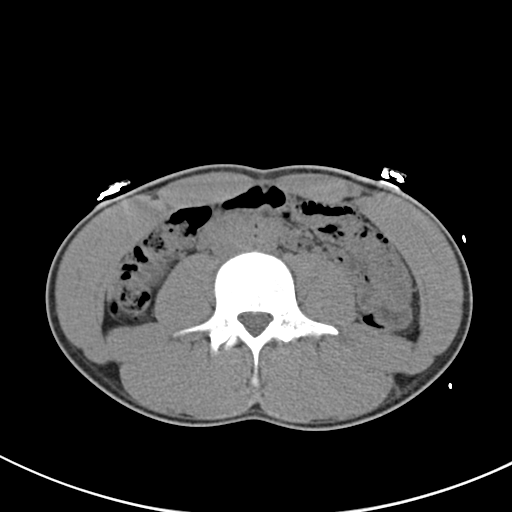
[im 59/88  soft-tissue]
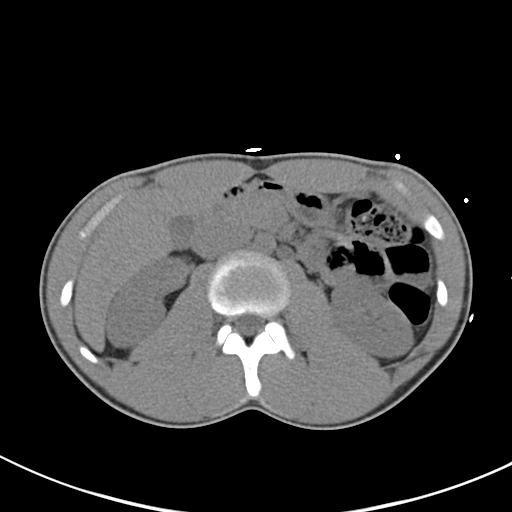
[im 59/88  bone]
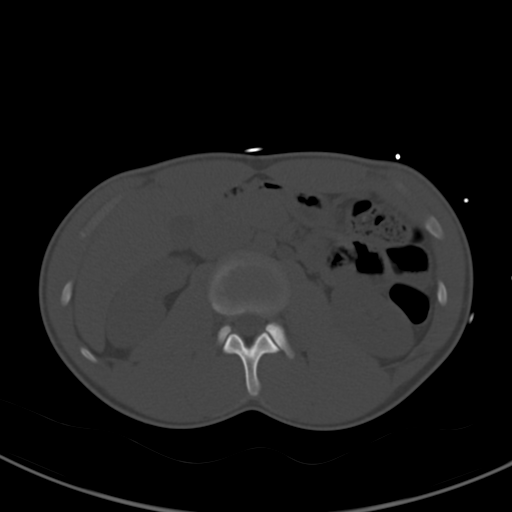
[im 63/88  soft-tissue]
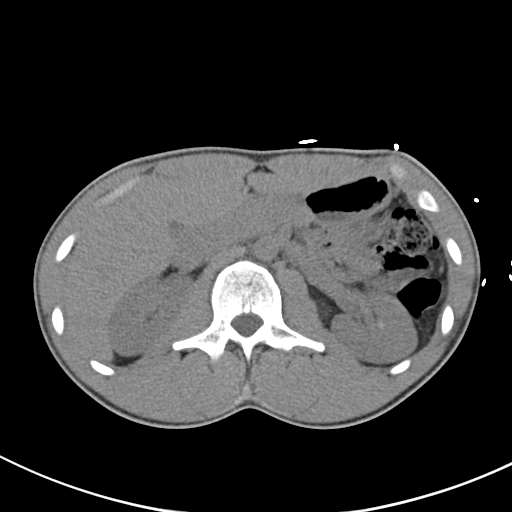
[im 68/88  soft-tissue]
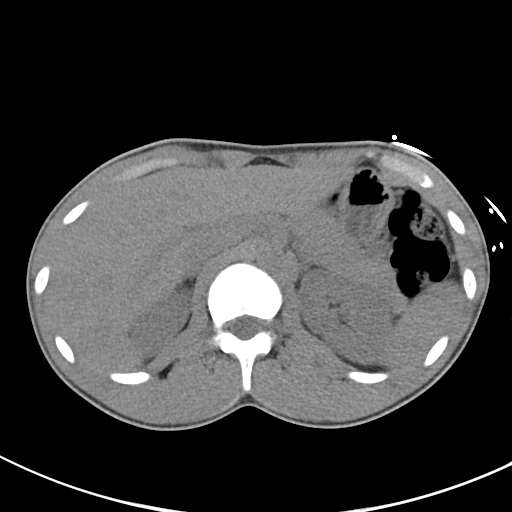
[im 78/88  soft-tissue]
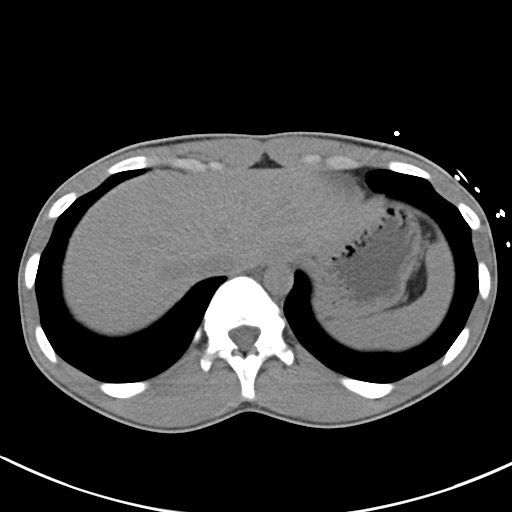
[im 83/88  soft-tissue]
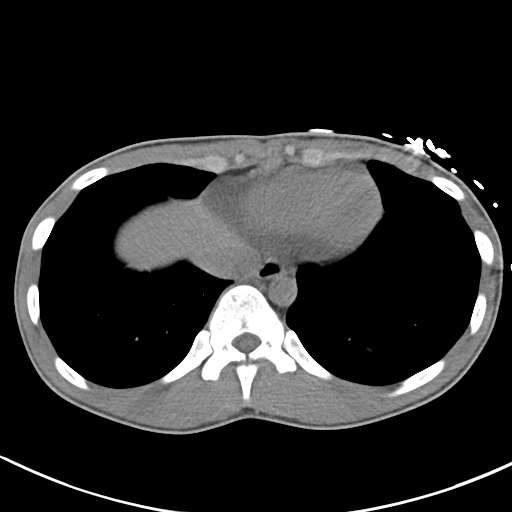

[Series 5: coronal · coronal · 0.72mm/px · 3 of 113 slices shown]
[im 38/113  soft-tissue]
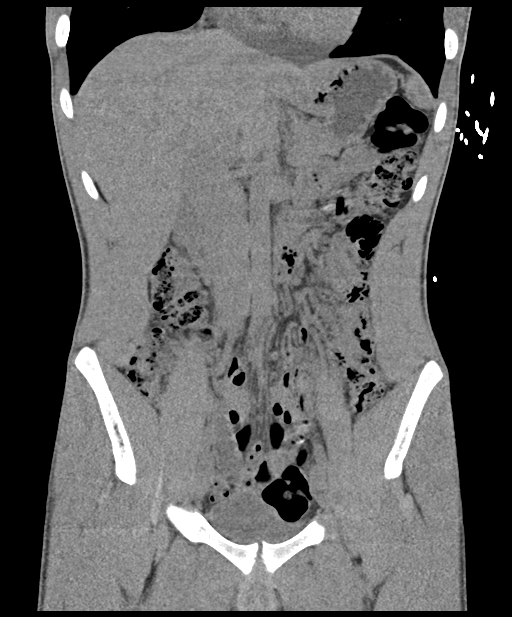
[im 50/113  soft-tissue]
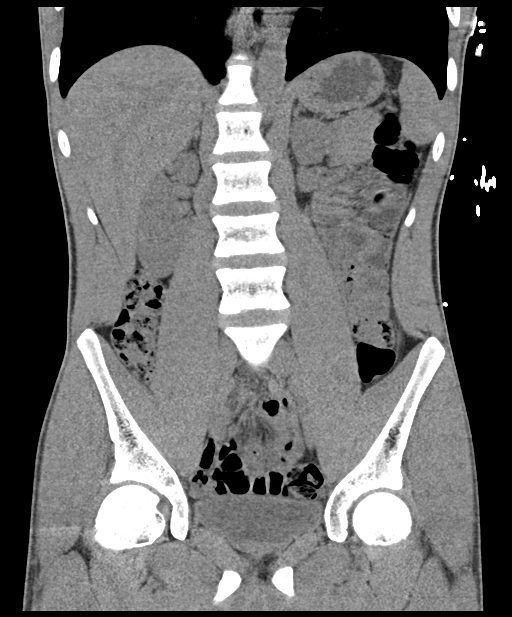
[im 63/113  soft-tissue]
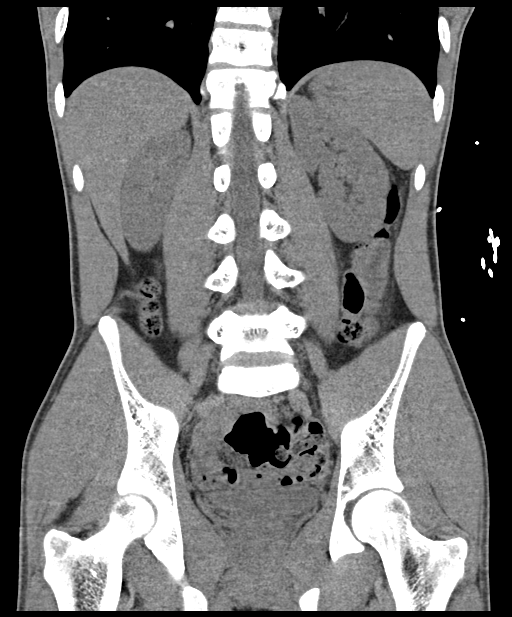

[16 of 46 positions shown; findings below may reference images not displayed]

FINDINGS: Lower chest: No significant pulmonary nodules or acute consolidative
airspace disease.

Hepatobiliary: Normal liver size. No liver mass. Normal gallbladder
with no radiopaque cholelithiasis. No biliary ductal dilatation.

Pancreas: Normal, with no mass or duct dilation.

Spleen: Normal size. No mass.

Adrenals/Urinary Tract: Normal adrenals. Faintly calcified
nonobstructing 3 mm interpolar left renal stone. No additional renal
stones. No hydronephrosis. No contour deforming renal masses. Normal
caliber ureters. No ureteral stones. Normal bladder.

Stomach/Bowel: Normal non-distended stomach. Normal caliber small
bowel with no small bowel wall thickening. Normal retrocecal
appendix. Normal large bowel with no diverticulosis, large bowel
wall thickening or pericolonic fat stranding.

Vascular/Lymphatic: Normal caliber abdominal aorta. No
pathologically enlarged lymph nodes in the abdomen or pelvis.

Reproductive: Normal size prostate.

Other: No pneumoperitoneum, ascites or focal fluid collection.

Musculoskeletal: No aggressive appearing focal osseous lesions.
IMPRESSION: Faintly calcified nonobstructing 3 mm interpolar left renal stone.
No hydronephrosis. No ureteral or bladder stones.

## 2023-02-21 ENCOUNTER — Other Ambulatory Visit: Payer: Self-pay

## 2023-02-21 ENCOUNTER — Encounter (HOSPITAL_BASED_OUTPATIENT_CLINIC_OR_DEPARTMENT_OTHER): Payer: Self-pay | Admitting: Emergency Medicine

## 2023-02-21 ENCOUNTER — Emergency Department (HOSPITAL_BASED_OUTPATIENT_CLINIC_OR_DEPARTMENT_OTHER)
Admission: EM | Admit: 2023-02-21 | Discharge: 2023-02-21 | Disposition: A | Discharge status: Home / Self Care | Payer: Medicaid Other | Attending: Emergency Medicine | Admitting: Emergency Medicine

## 2023-02-21 ENCOUNTER — Emergency Department (HOSPITAL_BASED_OUTPATIENT_CLINIC_OR_DEPARTMENT_OTHER): Payer: Medicaid Other | Admitting: Radiology

## 2023-02-21 DIAGNOSIS — B349 Viral infection, unspecified: Secondary | ICD-10-CM | POA: Diagnosis not present

## 2023-02-21 DIAGNOSIS — Z1152 Encounter for screening for COVID-19: Secondary | ICD-10-CM | POA: Diagnosis not present

## 2023-02-21 DIAGNOSIS — R109 Unspecified abdominal pain: Secondary | ICD-10-CM | POA: Diagnosis present

## 2023-02-21 LAB — CBC WITH DIFFERENTIAL/PLATELET
Abs Immature Granulocytes: 0.01 10*3/uL (ref 0.00–0.07)
Basophils Absolute: 0 10*3/uL (ref 0.0–0.1)
Basophils Relative: 0 %
Eosinophils Absolute: 0 10*3/uL (ref 0.0–0.5)
Eosinophils Relative: 0 %
HCT: 45.6 % (ref 39.0–52.0)
Hemoglobin: 15.9 g/dL (ref 13.0–17.0)
Immature Granulocytes: 0 %
Lymphocytes Relative: 17 %
Lymphs Abs: 0.9 10*3/uL (ref 0.7–4.0)
MCH: 28.4 pg (ref 26.0–34.0)
MCHC: 34.9 g/dL (ref 30.0–36.0)
MCV: 81.6 fL (ref 80.0–100.0)
Monocytes Absolute: 0.6 10*3/uL (ref 0.1–1.0)
Monocytes Relative: 12 %
Neutro Abs: 3.8 10*3/uL (ref 1.7–7.7)
Neutrophils Relative %: 71 %
Platelets: 163 10*3/uL (ref 150–400)
RBC: 5.59 MIL/uL (ref 4.22–5.81)
RDW: 12.2 % (ref 11.5–15.5)
WBC: 5.4 10*3/uL (ref 4.0–10.5)
nRBC: 0 % (ref 0.0–0.2)

## 2023-02-21 LAB — RESP PANEL BY RT-PCR (RSV, FLU A&B, COVID)  RVPGX2
Influenza A by PCR: NEGATIVE
Influenza B by PCR: NEGATIVE
Resp Syncytial Virus by PCR: NEGATIVE
SARS Coronavirus 2 by RT PCR: NEGATIVE

## 2023-02-21 LAB — URINALYSIS, ROUTINE W REFLEX MICROSCOPIC
Bilirubin Urine: NEGATIVE
Glucose, UA: NEGATIVE mg/dL
Hgb urine dipstick: NEGATIVE
Ketones, ur: NEGATIVE mg/dL
Leukocytes,Ua: NEGATIVE
Nitrite: NEGATIVE
Specific Gravity, Urine: 1.031 — ABNORMAL HIGH (ref 1.005–1.030)
pH: 8 (ref 5.0–8.0)

## 2023-02-21 LAB — COMPREHENSIVE METABOLIC PANEL
ALT: 16 U/L (ref 0–44)
AST: 19 U/L (ref 15–41)
Albumin: 4.9 g/dL (ref 3.5–5.0)
Alkaline Phosphatase: 73 U/L (ref 38–126)
Anion gap: 9 (ref 5–15)
BUN: 10 mg/dL (ref 6–20)
CO2: 25 mmol/L (ref 22–32)
Calcium: 9.8 mg/dL (ref 8.9–10.3)
Chloride: 104 mmol/L (ref 98–111)
Creatinine, Ser: 1.06 mg/dL (ref 0.61–1.24)
GFR, Estimated: >60 mL/min (ref >60)
Glucose, Bld: 101 mg/dL — ABNORMAL HIGH (ref 70–99)
Potassium: 3.8 mmol/L (ref 3.5–5.1)
Sodium: 138 mmol/L (ref 135–145)
Total Bilirubin: 3.7 mg/dL — ABNORMAL HIGH (ref 0.3–1.2)
Total Protein: 7.4 g/dL (ref 6.5–8.1)

## 2023-02-21 LAB — LACTIC ACID, PLASMA: Lactic Acid, Venous: 1.5 mmol/L (ref 0.5–1.9)

## 2023-02-21 MED ORDER — ACETAMINOPHEN 500 MG PO TABS: 500.0000 mg | ORAL_TABLET | Freq: Four times a day (QID) | ORAL | 0 refills | Status: AC | PRN

## 2023-02-21 MED ORDER — KETOROLAC TROMETHAMINE 15 MG/ML IJ SOLN
15.0000 mg | Freq: Once | INTRAMUSCULAR | Status: AC
  Administered 2023-02-21: 15 mg via INTRAVENOUS
  Filled 2023-02-21: qty 1

## 2023-02-21 MED ORDER — ONDANSETRON HCL 4 MG/2ML IJ SOLN
4.0000 mg | Freq: Once | INTRAMUSCULAR | Status: AC
  Administered 2023-02-21: 4 mg via INTRAVENOUS
  Filled 2023-02-21: qty 2

## 2023-02-21 MED ORDER — ONDANSETRON HCL 4 MG PO TABS: 4.0000 mg | ORAL_TABLET | Freq: Three times a day (TID) | ORAL | 0 refills | Status: DC | PRN

## 2023-02-21 MED ORDER — SODIUM CHLORIDE 0.9 % IV BOLUS
1000.0000 mL | Freq: Once | INTRAVENOUS | Status: AC
  Administered 2023-02-21: 1000 mL via INTRAVENOUS

## 2023-02-21 NOTE — ED Notes (Signed)
Provider at bedside

## 2023-02-21 NOTE — ED Notes (Signed)
Received report from Tiffany, RN.

## 2023-02-21 NOTE — ED Triage Notes (Signed)
Reports chest pain, left flank pain and nausea since yesterday Comes and goes

## 2023-02-21 NOTE — ED Provider Notes (Signed)
[place] EMERGENCY DEPARTMENT AT [facility] Provider Note   CSN: [id number] Arrival date & time: 02/21/23  1702     History  Chief Complaint  Patient presents with   Chest Pain   Flank Pain    Melvin Lewis is a 22 y.o. male.  The history is provided by the patient and medical records. No language interpreter was used.  Chest Pain Flank Pain Associated symptoms include chest pain.     22 year old male significant history of ADHD presenting complaining of bodyaches.  Patient reports since yesterday he developed pain to his left flank, body aches, pain in his chest, having chills, pain with breathing and overall not feeling well.  He just got out of jail yesterday.  Endorsed hot and cold.  Denies any runny nose sneezing or coughing no hemoptysis no vomiting or diarrhea no dysuria or hematuria.  Does endorse nausea.  No specific treatment tried.  Unsure if he had any recent sick contact.  Patient states he is otherwise healthy does not take medication regular basis.  Home Medications Prior to Admission medications   Medication Sig Start Date End Date Taking? Authorizing Provider  amphetamine-dextroamphetamine (ADDERALL) 20 MG tablet Take 20 mg by mouth 2 (two) times daily.    [provider]  famotidine (PEPCID) 20 MG tablet Take 1 tablet (20 mg total) by mouth 2 (two) times daily. 07/20/19   Renne Crigler, PA-C  loperamide (IMODIUM) 2 MG capsule Take 1 capsule (2 mg total) by mouth 4 (four) times daily as needed for diarrhea or loose stools. 09/28/22   Edwin Dada P, DO  naproxen (NAPROSYN) 500 MG tablet Take 1 tablet (500 mg total) by mouth 2 (two) times daily with a meal. 09/29/20   Caccavale, Sophia, PA-C  ondansetron (ZOFRAN) 4 MG tablet Take 1 tablet (4 mg total) by mouth every 6 (six) hours as needed for nausea or vomiting. 09/28/22   Franne Forts, DO  ondansetron (ZOFRAN-ODT) 4 MG disintegrating tablet 4mg  ODT q4 hours prn nausea/vomit 09/28/22   Melene Plan, DO  valACYclovir (VALTREX) 1000 MG tablet Take 1 tablet (1,000 mg total) by mouth 2 (two) times daily. 06/23/21   Horton, Mayer Masker, MD      Allergies    Patient has no known allergies.    Review of Systems   Review of Systems  Cardiovascular:  Positive for chest pain.  Genitourinary:  Positive for flank pain.  All other systems reviewed and are negative.   Physical Exam Updated Vital Signs BP 128/66   Pulse 69   Temp 98 F (36.7 C)   Resp 17   Ht 6\' 4"  (1.93 m)   Wt 73.5 kg   SpO2 100%   BMI 19.72 kg/m  Physical Exam Vitals and nursing note reviewed.  Constitutional:      General: He is not in acute distress.    Appearance: He is well-developed.  HENT:     Head: Atraumatic.     Nose: Nose normal.     Mouth/Throat:     Mouth: Mucous membranes are moist.  Eyes:     Conjunctiva/sclera: Conjunctivae normal.  Neck:     Vascular: No carotid bruit.  Cardiovascular:     Rate and Rhythm: Normal rate and regular rhythm.     Pulses: Normal pulses.     Heart sounds: Normal heart sounds.  Pulmonary:     Breath sounds: Normal breath sounds. No wheezing, rhonchi or rales.  Abdominal:  Palpations: Abdomen is soft.     Tenderness: There is no abdominal tenderness. There is no right CVA tenderness or left CVA tenderness.  Musculoskeletal:     Cervical back: Normal range of motion and neck supple. No rigidity or tenderness.  Lymphadenopathy:     Cervical: No cervical adenopathy.  Skin:    General: Skin is warm.     Capillary Refill: Capillary refill takes less than 2 seconds.     Findings: No rash.  Neurological:     Mental Status: He is alert and oriented to person, place, and time.  Psychiatric:        Mood and Affect: Mood normal.     ED Results / Procedures / Treatments   Labs (all labs ordered are listed, but only abnormal results are displayed) Labs Reviewed  COMPREHENSIVE METABOLIC PANEL - Abnormal; Notable for the following components:      Result  Value   Glucose, Bld 101 (*)    Total Bilirubin 3.7 (*)    All other components within normal limits  URINALYSIS, ROUTINE W REFLEX MICROSCOPIC - Abnormal; Notable for the following components:   Specific Gravity, Urine 1.031 (*)    Protein, ur TRACE (*)    All other components within normal limits  RESP PANEL BY RT-PCR (RSV, FLU A&B, COVID)  RVPGX2  LACTIC ACID, PLASMA  CBC WITH DIFFERENTIAL/PLATELET  LACTIC ACID, PLASMA    EKG None  Radiology DG Chest 2 View  Result Date: 02/21/2023 CLINICAL DATA:  Left-sided chest pain and left flank pain. EXAM: CHEST - 2 VIEW COMPARISON:  July 20, 2019 FINDINGS: The heart size and mediastinal contours are within normal limits. Both lungs are clear. The visualized skeletal structures are unremarkable. IMPRESSION: No active cardiopulmonary disease. Electronically Signed   By: Aram Candela M.D.   On: 02/21/2023 19:15    Procedures Procedures    Medications Ordered in ED Medications  sodium chloride 0.9 % bolus 1,000 mL (0 mLs Intravenous Stopped 02/21/23 1911)  ondansetron (ZOFRAN) injection 4 mg (4 mg Intravenous Given 02/21/23 1757)  ketorolac (TORADOL) 15 MG/ML injection 15 mg (15 mg Intravenous Given 02/21/23 1758)    ED Course/ Medical Decision Making/ A&P                             Medical Decision Making Amount and/or Complexity of Data Reviewed Labs: ordered. Radiology: ordered.  Risk Prescription drug management.   BP 135/74 (BP Location: Right Arm)   Pulse 76   Temp 98 F (36.7 C)   Resp 18   SpO2 100%   84:46 PM 22 year old male significant history of ADHD presenting complaining of bodyaches.  Patient reports since yesterday he developed pain to his left flank, body aches, pain in his chest, having chills, pain with breathing and overall not feeling well.  He just got out of jail yesterday.  Endorsed hot and cold.  Denies any runny nose sneezing or coughing no hemoptysis no vomiting or diarrhea no dysuria or  hematuria.  Does endorse nausea.  No specific treatment tried.  Unsure if he had any recent sick contact.  Patient states he is otherwise healthy does not take medication regular basis.  On exam well-appearing male laying in bed somewhat tearful but in no acute discomfort.  Heart with normal rate and rhythm, lungs clear to auscultation bilaterally abdomen is soft nontender no CVA tenderness no concerning rash patient moving all 4 extremities without difficulty.  He does not have any nuchal rigidity  -Labs ordered, independently viewed and interpreted by me.  Labs remarkable for reassuring labs -The patient was maintained on a cardiac monitor.  I personally viewed and interpreted the cardiac monitored which showed an underlying rhythm of: NSR -Imaging independently viewed and interpreted by me and I agree with radiologist's interpretation.  Result remarkable for CXR without concerning finding -This patient presents to the ED for concern of body aches, this involves an extensive number of treatment options, and is a complaint that carries with it a high risk of complications and morbidity.  The differential diagnosis includes viral illnes, covid, flu, rsv, pneumonia, UTI, pyelo -Co morbidities that complicate the patient evaluation includes none -Treatment includes IVF, toradol, zofran -Reevaluation of the patient after these medicines showed that the patient improved -PCP office notes or outside notes reviewed -Escalation to admission/observation considered: patients feels much better, is comfortable with discharge, and will follow up with PCP -Prescription medication considered, patient comfortable with tylenol, zofran -Social Determinant of Health considered which includes tobacco use  Suspect viral etiology.  Pt stable for discharge.  Doubt kidney stone.          Final Clinical Impression(s) / ED Diagnoses Final diagnoses:  Viral illness    Rx / DC Orders ED Discharge Orders           Ordered    acetaminophen (TYLENOL) 500 MG tablet  Every 6 hours PRN        02/21/23 2003    ondansetron (ZOFRAN) 4 MG tablet  Every 8 hours PRN        02/21/23 2003              Fayrene Helper, PA-C 02/21/23 Danie Binder, MD 02/21/23 2340

## 2023-02-21 NOTE — ED Notes (Signed)
Reviewed AVS with patient, patient expressed understanding of directions, denies further questions at this time. 

## 2023-07-29 ENCOUNTER — Other Ambulatory Visit: Payer: Self-pay

## 2023-07-29 ENCOUNTER — Encounter (HOSPITAL_BASED_OUTPATIENT_CLINIC_OR_DEPARTMENT_OTHER): Payer: Self-pay | Admitting: Emergency Medicine

## 2023-07-29 ENCOUNTER — Emergency Department (HOSPITAL_BASED_OUTPATIENT_CLINIC_OR_DEPARTMENT_OTHER)
Admission: EM | Admit: 2023-07-29 | Discharge: 2023-07-29 | Disposition: A | Discharge status: Home / Self Care | Payer: Medicaid Other | Attending: Emergency Medicine | Admitting: Emergency Medicine

## 2023-07-29 DIAGNOSIS — R300 Dysuria: Secondary | ICD-10-CM | POA: Insufficient documentation

## 2023-07-29 DIAGNOSIS — Z202 Contact with and (suspected) exposure to infections with a predominantly sexual mode of transmission: Secondary | ICD-10-CM | POA: Diagnosis present

## 2023-07-29 LAB — URINALYSIS, ROUTINE W REFLEX MICROSCOPIC
Bacteria, UA: NONE SEEN
Bilirubin Urine: NEGATIVE
Glucose, UA: NEGATIVE mg/dL
Ketones, ur: NEGATIVE mg/dL
Nitrite: NEGATIVE
Specific Gravity, Urine: 1.028 (ref 1.005–1.030)
WBC, UA: >50 WBC/hpf (ref 0–5)
pH: 7.5 (ref 5.0–8.0)

## 2023-07-29 MED ORDER — AZITHROMYCIN 250 MG PO TABS
1000.0000 mg | ORAL_TABLET | Freq: Once | ORAL | Status: AC
  Administered 2023-07-29: 1000 mg via ORAL
  Filled 2023-07-29: qty 4

## 2023-07-29 MED ORDER — METRONIDAZOLE 500 MG PO TABS
2000.0000 mg | ORAL_TABLET | Freq: Once | ORAL | Status: AC
  Administered 2023-07-29: 2000 mg via ORAL
  Filled 2023-07-29: qty 4

## 2023-07-29 MED ORDER — CEFTRIAXONE SODIUM 500 MG IJ SOLR
500.0000 mg | Freq: Once | INTRAMUSCULAR | Status: AC
  Administered 2023-07-29: 500 mg via INTRAMUSCULAR
  Filled 2023-07-29: qty 500

## 2023-07-29 MED ORDER — LIDOCAINE HCL (PF) 1 % IJ SOLN
1.0000 mL | Freq: Once | INTRAMUSCULAR | Status: AC
  Administered 2023-07-29: 1 mL
  Filled 2023-07-29: qty 5

## 2023-07-29 NOTE — ED Notes (Signed)
Discharge instructions and follow up care reviewed and explained, pt verbalized understanding and had no further questions on d/c. Pt caox4, ambulatory, NAD on d/c. 

## 2023-07-29 NOTE — ED Provider Notes (Signed)
[place] EMERGENCY DEPARTMENT AT [facility] Provider Note   CSN: [id number] Arrival date & time: 07/29/23  1619     History  Chief Complaint  Patient presents with   Exposure to STD   HPI Melvin Lewis is a 22 y.o. male presenting for STI concern. Endorses dysuria and penile discharge for one week. Denies rash. Denies abdominal pain or flank tenderness. States he is sexually active. States he hasn't consumed EtOH in 4 days.     Exposure to STD       Home Medications Prior to Admission medications   Medication Sig Start Date End Date Taking? Authorizing Provider  acetaminophen (TYLENOL) 500 MG tablet Take 1 tablet (500 mg total) by mouth every 6 (six) hours as needed. 02/21/23   Fayrene Helper, PA-C  amphetamine-dextroamphetamine (ADDERALL) 20 MG tablet Take 20 mg by mouth 2 (two) times daily.    [provider]  famotidine (PEPCID) 20 MG tablet Take 1 tablet (20 mg total) by mouth 2 (two) times daily. 07/20/19   Renne Crigler, PA-C  loperamide (IMODIUM) 2 MG capsule Take 1 capsule (2 mg total) by mouth 4 (four) times daily as needed for diarrhea or loose stools. 09/28/22   Edwin Dada P, DO  naproxen (NAPROSYN) 500 MG tablet Take 1 tablet (500 mg total) by mouth 2 (two) times daily with a meal. 09/29/20   Caccavale, Sophia, PA-C  ondansetron (ZOFRAN) 4 MG tablet Take 1 tablet (4 mg total) by mouth every 8 (eight) hours as needed for nausea or vomiting. 02/21/23   Fayrene Helper, PA-C  ondansetron (ZOFRAN-ODT) 4 MG disintegrating tablet 4mg  ODT q4 hours prn nausea/vomit 09/28/22   Melene Plan, DO  valACYclovir (VALTREX) 1000 MG tablet Take 1 tablet (1,000 mg total) by mouth 2 (two) times daily. 06/23/21   Horton, Mayer Masker, MD      Allergies    Patient has no known allergies.    Review of Systems   See HPI for pertinent positives  Physical Exam Updated Vital Signs BP 137/85 (BP Location: Right Arm)   Pulse 79   Temp 98.4 F (36.9 C) (Oral)   Resp 18    SpO2 98%  Physical Exam Constitutional:      Appearance: Normal appearance.  HENT:     Head: Normocephalic.     Nose: Nose normal.  Eyes:     Conjunctiva/sclera: Conjunctivae normal.  Pulmonary:     Effort: Pulmonary effort is normal.  Neurological:     Mental Status: He is alert.  Psychiatric:        Mood and Affect: Mood normal.     ED Results / Procedures / Treatments   Labs (all labs ordered are listed, but only abnormal results are displayed) Labs Reviewed  RPR  URINALYSIS, ROUTINE W REFLEX MICROSCOPIC  HIV ANTIBODY (ROUTINE TESTING W REFLEX)  GC/CHLAMYDIA PROBE AMP ([place]) NOT AT [facility]    EKG None  Radiology No results found.  Procedures Procedures    Medications Ordered in ED Medications  azithromycin (ZITHROMAX) tablet 1,000 mg (has no administration in time range)  cefTRIAXone (ROCEPHIN) injection 500 mg (has no administration in time range)  lidocaine (PF) (XYLOCAINE) 1 % injection 1-2.1 mL (has no administration in time range)  metroNIDAZOLE (FLAGYL) tablet 2,000 mg (has no administration in time range)    ED Course/ Medical Decision Making/ A&P  Medical Decision Making          Final Clinical Impression(s) / ED Diagnoses Final diagnoses:  Possible exposure to STI    Rx / DC Orders ED Discharge Orders     None         Gareth Eagle, PA-C 07/29/23 1736    Melene Plan, DO 07/29/23 1806

## 2023-07-29 NOTE — ED Triage Notes (Signed)
Burning while peeing and discharge started last week  request std testing

## 2023-07-29 NOTE — Discharge Instructions (Addendum)
Treating you for STI exposure. Follow up on labs on myChart. Abstain from sex until then. If positive, please advise sexual partner(s) to seek out medical treatment at the health department.

## 2023-07-30 LAB — RPR: RPR Ser Ql: NONREACTIVE

## 2023-07-30 LAB — HIV ANTIBODY (ROUTINE TESTING W REFLEX): HIV Screen 4th Generation wRfx: NONREACTIVE

## 2023-08-02 LAB — GC/CHLAMYDIA PROBE AMP (~~LOC~~) NOT AT ARMC
Chlamydia: POSITIVE — AB
Comment: NEGATIVE
Comment: NORMAL
Neisseria Gonorrhea: POSITIVE — AB

## 2023-09-02 ENCOUNTER — Other Ambulatory Visit: Payer: Self-pay

## 2023-09-02 DIAGNOSIS — Z5321 Procedure and treatment not carried out due to patient leaving prior to being seen by health care provider: Secondary | ICD-10-CM | POA: Insufficient documentation

## 2023-09-02 DIAGNOSIS — Z202 Contact with and (suspected) exposure to infections with a predominantly sexual mode of transmission: Secondary | ICD-10-CM | POA: Insufficient documentation

## 2023-09-02 NOTE — ED Triage Notes (Signed)
Pt was told by sexual partner that she had chlamydia and something else he has never hear of. Pt denies any symptoms.

## 2023-09-03 ENCOUNTER — Emergency Department (HOSPITAL_BASED_OUTPATIENT_CLINIC_OR_DEPARTMENT_OTHER)
Admission: EM | Admit: 2023-09-03 | Discharge: 2023-09-03 | Discharge status: Against Medical Advice | Payer: Medicaid Other | Attending: Emergency Medicine | Admitting: Emergency Medicine

## 2023-09-03 DIAGNOSIS — Z5321 Procedure and treatment not carried out due to patient leaving prior to being seen by health care provider: Secondary | ICD-10-CM

## 2023-09-03 LAB — HIV ANTIBODY (ROUTINE TESTING W REFLEX): HIV Screen 4th Generation wRfx: NONREACTIVE

## 2023-09-03 NOTE — ED Notes (Signed)
Pt stated he could not wait any longer. Encouraged to stay but he refused. NAD NARD upon DC

## 2023-09-04 NOTE — ED Provider Notes (Signed)
[place] EMERGENCY DEPARTMENT AT [facility] Provider Note   CSN: [id number] Arrival date & time: 09/02/23  2335     History  Chief Complaint  Patient presents with   std check    Melvin Lewis is a 22 y.o. male.  Patient here with concern for STD.  He states that he is a kidney stone in the past and is not sure if it is that but he has burning when he gets done pain.  Denies discharge.  Does have unprotected intercourse with other people that have STDs.  He has a history of STDs as well.        Home Medications Prior to Admission medications   Medication Sig Start Date End Date Taking? Authorizing Provider  acetaminophen (TYLENOL) 500 MG tablet Take 1 tablet (500 mg total) by mouth every 6 (six) hours as needed. 02/21/23   Fayrene Helper, PA-C  amphetamine-dextroamphetamine (ADDERALL) 20 MG tablet Take 20 mg by mouth 2 (two) times daily.    [provider]  famotidine (PEPCID) 20 MG tablet Take 1 tablet (20 mg total) by mouth 2 (two) times daily. 07/20/19   Renne Crigler, PA-C  loperamide (IMODIUM) 2 MG capsule Take 1 capsule (2 mg total) by mouth 4 (four) times daily as needed for diarrhea or loose stools. 09/28/22   Edwin Dada P, DO  naproxen (NAPROSYN) 500 MG tablet Take 1 tablet (500 mg total) by mouth 2 (two) times daily with a meal. 09/29/20   Caccavale, Sophia, PA-C  ondansetron (ZOFRAN) 4 MG tablet Take 1 tablet (4 mg total) by mouth every 8 (eight) hours as needed for nausea or vomiting. 02/21/23   Fayrene Helper, PA-C  ondansetron (ZOFRAN-ODT) 4 MG disintegrating tablet 4mg  ODT q4 hours prn nausea/vomit 09/28/22   Melene Plan, DO  valACYclovir (VALTREX) 1000 MG tablet Take 1 tablet (1,000 mg total) by mouth 2 (two) times daily. 06/23/21   Horton, Mayer Masker, MD      Allergies    Patient has no known allergies.    Review of Systems   Review of Systems  Physical Exam Updated Vital Signs BP 122/86   Pulse 64   Temp 98.2 F (36.8 C)   Resp 18    Ht 6\' 4"  (1.93 m)   Wt 74.8 kg   SpO2 96%   BMI 20.08 kg/m  Physical Exam Vitals and nursing note reviewed.  Constitutional:      Appearance: He is well-developed.  HENT:     Head: Normocephalic and atraumatic.  Cardiovascular:     Rate and Rhythm: Normal rate.  Pulmonary:     Effort: Pulmonary effort is normal. No respiratory distress.  Abdominal:     General: There is no distension.  Genitourinary:    Comments: Chaperoned by nurse Pecola Leisure, small amount of purulent discharge from urethra.  No evidence urethritis.  No rashes. Musculoskeletal:        General: Normal range of motion.     Cervical back: Normal range of motion.  Neurological:     Mental Status: He is alert.     ED Results / Procedures / Treatments   Labs (all labs ordered are listed, but only abnormal results are displayed) Labs Reviewed  HIV ANTIBODY (ROUTINE TESTING W REFLEX)  GC/CHLAMYDIA PROBE AMP ([place]) NOT AT [facility]    EKG None  Radiology No results found.  Procedures Procedures    Medications Ordered in ED Medications - No data to display  ED Course/ Medical Decision  Making/ A&P                                 Medical Decision Making  Treated prophylactically secondary to discharge, risky sexual practices pending his cultures.  No evidence of complicated disease.  No other complaints at this time.   Final Clinical Impression(s) / ED Diagnoses Final diagnoses:  None    Rx / DC Orders ED Discharge Orders     None         Nazar Kuan, Barbara Cower, MD 09/04/23 [phone]

## 2023-09-05 NOTE — ED Provider Notes (Signed)
Patient left without being seen by a provider.    Latrise Bowland, Barbara Cower, MD 09/05/23 [phone]

## 2023-09-07 LAB — GC/CHLAMYDIA PROBE AMP (~~LOC~~) NOT AT ARMC
Chlamydia: POSITIVE — AB
Comment: NEGATIVE
Comment: NORMAL
Neisseria Gonorrhea: NEGATIVE

## 2023-09-23 ENCOUNTER — Encounter (HOSPITAL_COMMUNITY): Payer: Self-pay

## 2023-09-23 ENCOUNTER — Other Ambulatory Visit: Payer: Self-pay

## 2023-09-23 ENCOUNTER — Inpatient Hospital Stay (HOSPITAL_COMMUNITY): Payer: Medicaid Other

## 2023-09-23 ENCOUNTER — Emergency Department (HOSPITAL_COMMUNITY): Payer: Medicaid Other

## 2023-09-23 ENCOUNTER — Inpatient Hospital Stay (HOSPITAL_COMMUNITY)
Admission: EM | Admit: 2023-09-23 | Discharge: 2023-09-25 | DRG: 562 | Disposition: A | Discharge status: Home / Self Care | Payer: Medicaid Other | Attending: Surgery | Admitting: Surgery

## 2023-09-23 DIAGNOSIS — S42002B Fracture of unspecified part of left clavicle, initial encounter for open fracture: Principal | ICD-10-CM | POA: Diagnosis present

## 2023-09-23 DIAGNOSIS — F909 Attention-deficit hyperactivity disorder, unspecified type: Secondary | ICD-10-CM | POA: Diagnosis present

## 2023-09-23 DIAGNOSIS — S1093XA Contusion of unspecified part of neck, initial encounter: Secondary | ICD-10-CM | POA: Diagnosis present

## 2023-09-23 DIAGNOSIS — S42002A Fracture of unspecified part of left clavicle, initial encounter for closed fracture: Secondary | ICD-10-CM

## 2023-09-23 DIAGNOSIS — J9601 Acute respiratory failure with hypoxia: Secondary | ICD-10-CM | POA: Diagnosis present

## 2023-09-23 DIAGNOSIS — W3400XA Accidental discharge from unspecified firearms or gun, initial encounter: Secondary | ICD-10-CM | POA: Diagnosis not present

## 2023-09-23 LAB — COMPREHENSIVE METABOLIC PANEL
ALT: 15 U/L (ref 0–44)
AST: 30 U/L (ref 15–41)
Albumin: 4.6 g/dL (ref 3.5–5.0)
Alkaline Phosphatase: 69 U/L (ref 38–126)
Anion gap: 12 (ref 5–15)
BUN: 10 mg/dL (ref 6–20)
CO2: 20 mmol/L — ABNORMAL LOW (ref 22–32)
Calcium: 8.9 mg/dL (ref 8.9–10.3)
Chloride: 104 mmol/L (ref 98–111)
Creatinine, Ser: 1.2 mg/dL (ref 0.61–1.24)
GFR, Estimated: >60 mL/min (ref >60)
Glucose, Bld: 111 mg/dL — ABNORMAL HIGH (ref 70–99)
Potassium: 3.6 mmol/L (ref 3.5–5.1)
Sodium: 136 mmol/L (ref 135–145)
Total Bilirubin: 2.2 mg/dL — ABNORMAL HIGH (ref 0.0–1.2)
Total Protein: 7.1 g/dL (ref 6.5–8.1)

## 2023-09-23 LAB — CBC
HCT: 46.6 % (ref 39.0–52.0)
HCT: 36.3 % — ABNORMAL LOW (ref 39.0–52.0)
Hemoglobin: 15.7 g/dL (ref 13.0–17.0)
Hemoglobin: 12.8 g/dL — ABNORMAL LOW (ref 13.0–17.0)
MCH: 28.6 pg (ref 26.0–34.0)
MCH: 28.7 pg (ref 26.0–34.0)
MCHC: 33.7 g/dL (ref 30.0–36.0)
MCHC: 35.3 g/dL (ref 30.0–36.0)
MCV: 84.9 fL (ref 80.0–100.0)
MCV: 81.4 fL (ref 80.0–100.0)
Platelets: 198 10*3/uL (ref 150–400)
Platelets: 119 10*3/uL — ABNORMAL LOW (ref 150–400)
RBC: 5.49 MIL/uL (ref 4.22–5.81)
RBC: 4.46 MIL/uL (ref 4.22–5.81)
RDW: 12.7 % (ref 11.5–15.5)
RDW: 12.4 % (ref 11.5–15.5)
WBC: 7.8 10*3/uL (ref 4.0–10.5)
WBC: 7.9 10*3/uL (ref 4.0–10.5)
nRBC: 0 % (ref 0.0–0.2)
nRBC: 0 % (ref 0.0–0.2)

## 2023-09-23 LAB — I-STAT CHEM 8, ED
BUN: 12 mg/dL (ref 6–20)
Calcium, Ion: 1.18 mmol/L (ref 1.15–1.40)
Chloride: 103 mmol/L (ref 98–111)
Creatinine, Ser: 1 mg/dL (ref 0.61–1.24)
Glucose, Bld: 112 mg/dL — ABNORMAL HIGH (ref 70–99)
HCT: 47 % (ref 39.0–52.0)
Hemoglobin: 16 g/dL (ref 13.0–17.0)
Potassium: 3.6 mmol/L (ref 3.5–5.1)
Sodium: 142 mmol/L (ref 135–145)
TCO2: 25 mmol/L (ref 22–32)

## 2023-09-23 LAB — MRSA NEXT GEN BY PCR, NASAL: MRSA by PCR Next Gen: NOT DETECTED

## 2023-09-23 LAB — SAMPLE TO BLOOD BANK

## 2023-09-23 LAB — ETHANOL: Alcohol, Ethyl (B): <10 mg/dL (ref <10)

## 2023-09-23 LAB — I-STAT CG4 LACTIC ACID, ED: Lactic Acid, Venous: 4.2 mmol/L (ref 0.5–1.9)

## 2023-09-23 LAB — PROTIME-INR
INR: 1.1 (ref 0.8–1.2)
Prothrombin Time: 14 s (ref 11.4–15.2)

## 2023-09-23 MED ORDER — ROCURONIUM BROMIDE 10 MG/ML (PF) SYRINGE
PREFILLED_SYRINGE | INTRAVENOUS | Status: AC
  Filled 2023-09-23: qty 10

## 2023-09-23 MED ORDER — METHOCARBAMOL 1000 MG/10ML IJ SOLN
500.0000 mg | Freq: Three times a day (TID) | INTRAMUSCULAR | Status: DC
  Administered 2023-09-23 – 2023-09-24 (×3): 500 mg via INTRAVENOUS
  Filled 2023-09-23 (×3): qty 10

## 2023-09-23 MED ORDER — POLYETHYLENE GLYCOL 3350 17 G PO PACK
17.0000 g | PACK | Freq: Every day | ORAL | Status: DC
  Administered 2023-09-23 – 2023-09-24 (×2): 17 g
  Filled 2023-09-23 (×2): qty 1

## 2023-09-23 MED ORDER — PROPOFOL 1000 MG/100ML IV EMUL
0.0000 ug/kg/min | INTRAVENOUS | Status: DC
  Administered 2023-09-23: 25 ug/kg/min via INTRAVENOUS
  Administered 2023-09-23: 35 ug/kg/min via INTRAVENOUS
  Administered 2023-09-24: 25 ug/kg/min via INTRAVENOUS
  Filled 2023-09-23 (×3): qty 100

## 2023-09-23 MED ORDER — FENTANYL CITRATE PF 50 MCG/ML IJ SOSY
PREFILLED_SYRINGE | INTRAMUSCULAR | Status: AC
  Filled 2023-09-23: qty 2

## 2023-09-23 MED ORDER — MIDAZOLAM HCL 2 MG/2ML IJ SOLN
INTRAMUSCULAR | Status: AC
  Filled 2023-09-23: qty 2

## 2023-09-23 MED ORDER — HYDROMORPHONE HCL 1 MG/ML IJ SOLN
1.0000 mg | Freq: Once | INTRAMUSCULAR | Status: AC
  Administered 2023-09-23: 1 mg via INTRAVENOUS
  Filled 2023-09-23: qty 1

## 2023-09-23 MED ORDER — PROPOFOL 1000 MG/100ML IV EMUL
INTRAVENOUS | Status: AC
  Filled 2023-09-23: qty 100

## 2023-09-23 MED ORDER — ONDANSETRON HCL 4 MG/2ML IJ SOLN
4.0000 mg | Freq: Four times a day (QID) | INTRAMUSCULAR | Status: DC | PRN
  Administered 2023-09-24 (×2): 4 mg via INTRAVENOUS
  Filled 2023-09-23 (×2): qty 2

## 2023-09-23 MED ORDER — DOCUSATE SODIUM 50 MG/5ML PO LIQD: 100.0000 mg | Freq: Two times a day (BID) | ORAL | Status: DC

## 2023-09-23 MED ORDER — ALBUMIN HUMAN 5 % IV SOLN
12.5000 g | Freq: Once | INTRAVENOUS | Status: AC
  Administered 2023-09-23: 12.5 g via INTRAVENOUS
  Filled 2023-09-23: qty 250

## 2023-09-23 MED ORDER — KETAMINE HCL 50 MG/5ML IJ SOSY
PREFILLED_SYRINGE | INTRAMUSCULAR | Status: AC
  Filled 2023-09-23: qty 10

## 2023-09-23 MED ORDER — CHLORHEXIDINE GLUCONATE CLOTH 2 % EX PADS
6.0000 | MEDICATED_PAD | Freq: Every day | CUTANEOUS | Status: DC
  Administered 2023-09-23 – 2023-09-24 (×2): 6 via TOPICAL

## 2023-09-23 MED ORDER — IOHEXOL 350 MG/ML SOLN
75.0000 mL | Freq: Once | INTRAVENOUS | Status: AC | PRN
  Administered 2023-09-23: 50 mL via INTRAVENOUS

## 2023-09-23 MED ORDER — IOHEXOL 350 MG/ML SOLN
75.0000 mL | Freq: Once | INTRAVENOUS | Status: AC | PRN
  Administered 2023-09-23: 75 mL via INTRAVENOUS

## 2023-09-23 MED ORDER — ORAL CARE MOUTH RINSE: 15.0000 mL | OROMUCOSAL | Status: DC | PRN

## 2023-09-23 MED ORDER — KCL IN DEXTROSE-NACL 20-5-0.45 MEQ/L-%-% IV SOLN
INTRAVENOUS | Status: AC
  Filled 2023-09-23 (×3): qty 1000

## 2023-09-23 MED ORDER — HYDRALAZINE HCL 20 MG/ML IJ SOLN: 10.0000 mg | INTRAMUSCULAR | Status: DC | PRN

## 2023-09-23 MED ORDER — METOPROLOL TARTRATE 5 MG/5ML IV SOLN: 5.0000 mg | Freq: Four times a day (QID) | INTRAVENOUS | Status: DC | PRN

## 2023-09-23 MED ORDER — ORAL CARE MOUTH RINSE
15.0000 mL | OROMUCOSAL | Status: DC
  Administered 2023-09-23 – 2023-09-24 (×12): 15 mL via OROMUCOSAL

## 2023-09-23 MED ORDER — ONDANSETRON 4 MG PO TBDP: 4.0000 mg | ORAL_TABLET | Freq: Four times a day (QID) | ORAL | Status: DC | PRN

## 2023-09-23 MED ORDER — SODIUM CHLORIDE 0.9 % IV BOLUS
1000.0000 mL | Freq: Once | INTRAVENOUS | Status: AC
  Administered 2023-09-23: 1000 mL via INTRAVENOUS

## 2023-09-23 MED ORDER — DOCUSATE SODIUM 50 MG/5ML PO LIQD
100.0000 mg | Freq: Two times a day (BID) | ORAL | Status: DC
  Administered 2023-09-23 – 2023-09-24 (×4): 100 mg
  Filled 2023-09-23 (×4): qty 10

## 2023-09-23 MED ORDER — ETOMIDATE 2 MG/ML IV SOLN
INTRAVENOUS | Status: AC
  Administered 2023-09-23: 20 mg
  Filled 2023-09-23: qty 20

## 2023-09-23 MED ORDER — FENTANYL 2500MCG IN NS 250ML (10MCG/ML) PREMIX INFUSION
50.0000 ug/h | INTRAVENOUS | Status: DC
  Administered 2023-09-23 – 2023-09-24 (×3): 200 ug/h via INTRAVENOUS
  Filled 2023-09-23 (×3): qty 250

## 2023-09-23 MED ORDER — FENTANYL CITRATE PF 50 MCG/ML IJ SOSY: 50.0000 ug | PREFILLED_SYRINGE | Freq: Once | INTRAMUSCULAR | Status: DC

## 2023-09-23 MED ORDER — METHOCARBAMOL 500 MG PO TABS
500.0000 mg | ORAL_TABLET | Freq: Three times a day (TID) | ORAL | Status: DC
  Administered 2023-09-24 – 2023-09-25 (×4): 500 mg
  Filled 2023-09-23 (×6): qty 1

## 2023-09-23 MED ORDER — POLYETHYLENE GLYCOL 3350 17 G PO PACK
17.0000 g | PACK | Freq: Every day | ORAL | Status: DC | PRN
Start: 2023-09-23 — End: 2023-09-25

## 2023-09-23 MED ORDER — SUCCINYLCHOLINE CHLORIDE 200 MG/10ML IV SOSY
PREFILLED_SYRINGE | INTRAVENOUS | Status: AC
  Administered 2023-09-23: 100 mg
  Filled 2023-09-23: qty 10

## 2023-09-23 MED ORDER — FENTANYL 2500MCG IN NS 250ML (10MCG/ML) PREMIX INFUSION
INTRAVENOUS | Status: AC
  Administered 2023-09-23: 200 ug/h via INTRAVENOUS
  Filled 2023-09-23: qty 250

## 2023-09-23 MED ORDER — FENTANYL BOLUS VIA INFUSION
50.0000 ug | INTRAVENOUS | Status: DC | PRN
Start: 2023-09-23 — End: 2023-09-24
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
## 2023-09-23 NOTE — Progress Notes (Signed)
 Transition of Care [facility]) - CAGE-AID Screening   Patient Details  Name: Melvin Lewis MRN: [id number] Date of Birth: 2000-10-03  [facility] CM/SW Contact:    [name], RN Phone Number: 09/23/2023, 5:30 AM   Clinical Narrative:  Unable to complete screening, patient currently intubated.  CAGE-AID Screening: Substance Abuse Screening unable to be completed due to: : Patient unable to participate (patient currently intubated)

## 2023-09-23 NOTE — ED Notes (Signed)
 RSI  0439 20 Et[place]  0440 100 Succs 0440 ET 7.5 24 @ teeth color change present Present Dr. Pilar Plate, RT, Aura Fey RN, Alphonzo Lemmings RN, [employer]
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
## 2023-09-23 NOTE — Progress Notes (Signed)
 Family updated on the strict visitor policy: discussed the two allowed for strict policy due to the confidential patient flag. Mother, Sister, and Girlfriend placed on the list of designated list and provided this nurse of the password.
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
## 2023-09-23 NOTE — Progress Notes (Signed)
 Trauma Event Note   Pt CTA performed and pt now in 4N30.  Report given to [place], CHARITY FUNDRAISER. VSS and pt resting comfortably.   Last imported Vital Signs BP 109/64   Pulse (!) 59   Temp 98.1 F (36.7 C)   Resp 18   Ht 6' 3.98 (1.93 m)   Wt 165 lb (74.8 kg)   SpO2 100%   BMI 20.09 kg/m   Trending CBC Recent Labs    09/23/23 0124  WBC 7.8  HGB 15.7  16.0  HCT 46.6  47.0  PLT 198    Trending Coag's Recent Labs    09/23/23 0124  INR 1.1    Trending BMET Recent Labs    09/23/23 0124  NA 136  142  K 3.6  3.6  CL 104  103  CO2 20*  BUN 10  12  CREATININE 1.20  1.00  GLUCOSE 111*  112*    Alee Gressman W  Trauma Response RN  Please call TRN at [phone] for further assistance.
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
## 2023-09-24 ENCOUNTER — Inpatient Hospital Stay (HOSPITAL_COMMUNITY): Payer: Medicaid Other

## 2023-09-24 LAB — BASIC METABOLIC PANEL
Anion gap: 11 (ref 5–15)
BUN: <5 mg/dL — ABNORMAL LOW (ref 6–20)
CO2: 20 mmol/L — ABNORMAL LOW (ref 22–32)
Calcium: 9.5 mg/dL (ref 8.9–10.3)
Chloride: 107 mmol/L (ref 98–111)
Creatinine, Ser: 1.12 mg/dL (ref 0.61–1.24)
GFR, Estimated: >60 mL/min (ref >60)
Glucose, Bld: 96 mg/dL (ref 70–99)
Potassium: 3.6 mmol/L (ref 3.5–5.1)
Sodium: 138 mmol/L (ref 135–145)

## 2023-09-24 LAB — CBC
HCT: 44.7 % (ref 39.0–52.0)
Hemoglobin: 15.1 g/dL (ref 13.0–17.0)
MCH: 28.5 pg (ref 26.0–34.0)
MCHC: 33.8 g/dL (ref 30.0–36.0)
MCV: 84.3 fL (ref 80.0–100.0)
Platelets: 119 10*3/uL — ABNORMAL LOW (ref 150–400)
RBC: 5.3 MIL/uL (ref 4.22–5.81)
RDW: 12.5 % (ref 11.5–15.5)
WBC: 9.2 10*3/uL (ref 4.0–10.5)
nRBC: 0 % (ref 0.0–0.2)

## 2023-09-24 LAB — TRIGLYCERIDES: Triglycerides: 84 mg/dL (ref <150)

## 2023-09-24 MED ORDER — ORAL CARE MOUTH RINSE: 15.0000 mL | OROMUCOSAL | Status: DC | PRN

## 2023-09-24 MED ORDER — ACETAMINOPHEN 500 MG PO TABS
1000.0000 mg | ORAL_TABLET | Freq: Four times a day (QID) | ORAL | Status: DC
Start: 2023-09-24 — End: 2023-09-25
  Administered 2023-09-24 – 2023-09-25 (×4): 1000 mg via ORAL
  Filled 2023-09-24 (×4): qty 2

## 2023-09-24 MED ORDER — OXYCODONE HCL 5 MG PO TABS
5.0000 mg | ORAL_TABLET | ORAL | Status: DC | PRN
Start: 2023-09-24 — End: 2023-09-25
  Administered 2023-09-24 – 2023-09-25 (×4): 5 mg via ORAL
  Filled 2023-09-24 (×4): qty 1

## 2023-09-24 MED ORDER — INFLUENZA VIRUS VACC SPLIT PF (FLUZONE) 0.5 ML IM SUSY
0.5000 mL | PREFILLED_SYRINGE | INTRAMUSCULAR | Status: DC
  Filled 2023-09-24: qty 0.5

## 2023-09-24 MED ORDER — DOXYCYCLINE HYCLATE 100 MG PO TABS
100.0000 mg | ORAL_TABLET | Freq: Two times a day (BID) | ORAL | Status: DC
  Administered 2023-09-24 (×2): 100 mg via ORAL
  Filled 2023-09-24 (×4): qty 1

## 2023-09-24 MED ORDER — MORPHINE SULFATE (PF) 2 MG/ML IV SOLN
2.0000 mg | INTRAVENOUS | Status: DC | PRN
Start: 2023-09-24 — End: 2023-09-25

## 2023-09-24 NOTE — Progress Notes (Signed)
   09/24/23 1636  Spiritual Encounters  Type of Visit Initial  Care provided to: Pt and family  Referral source Nurse (RN/NT/LPN)  Reason for visit Routine spiritual support  OnCall Visit No   Visited patient per spiritual consult, however patient was unavailable as he had visitors.  Will follow-up

## 2023-09-24 NOTE — Progress Notes (Signed)
 Patient ID: Melvin Lewis, male   DOB: 2001/05/02, 23 y.o.   MRN: [id number]    His neck has not had any further swelling and in fact looks better. Still soft and less extent of the area of swelling. No need for intervention. Extubation per trauma.
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
## 2023-09-24 NOTE — Procedures (Signed)
 Extubation Procedure Note  Patient Details:   Name: Melvin Lewis DOB: 12/08/2000 MRN: [id number]   Airway Documentation:    Vent end date: 09/24/23 Vent end time: 0921   Evaluation  O2 sats: stable throughout Complications: No apparent complications Patient did tolerate procedure well. Bilateral Breath Sounds: Clear   Yes  [name]; [name] 09/24/2023, 9:25 AM

## 2023-09-24 NOTE — Progress Notes (Signed)
 Orthopedic Tech Progress Note Patient Details:  Melvin Lewis 04-20-2001 [id number]  Ortho Devices Type of Ortho Device: Shoulder immobilizer Ortho Device/Splint Location: LUE Ortho Device/Splint Interventions: Ordered, Application, Adjustment   Post Interventions Patient Tolerated: Well Instructions Provided: Adjustment of device  [name] 09/24/2023, 10:51 AM
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
## 2023-09-25 ENCOUNTER — Other Ambulatory Visit (HOSPITAL_COMMUNITY): Payer: Self-pay

## 2023-09-25 LAB — CBC
HCT: 39 % (ref 39.0–52.0)
Hemoglobin: 13.5 g/dL (ref 13.0–17.0)
MCH: 28.3 pg (ref 26.0–34.0)
MCHC: 34.6 g/dL (ref 30.0–36.0)
MCV: 81.8 fL (ref 80.0–100.0)
Platelets: 108 10*3/uL — ABNORMAL LOW (ref 150–400)
RBC: 4.77 MIL/uL (ref 4.22–5.81)
RDW: 12.3 % (ref 11.5–15.5)
WBC: 9 10*3/uL (ref 4.0–10.5)
nRBC: 0 % (ref 0.0–0.2)

## 2023-09-25 LAB — BASIC METABOLIC PANEL
Anion gap: 7 (ref 5–15)
BUN: 6 mg/dL (ref 6–20)
CO2: 23 mmol/L (ref 22–32)
Calcium: 9.1 mg/dL (ref 8.9–10.3)
Chloride: 106 mmol/L (ref 98–111)
Creatinine, Ser: 0.92 mg/dL (ref 0.61–1.24)
GFR, Estimated: >60 mL/min (ref >60)
Glucose, Bld: 108 mg/dL — ABNORMAL HIGH (ref 70–99)
Potassium: 3.3 mmol/L — ABNORMAL LOW (ref 3.5–5.1)
Sodium: 136 mmol/L (ref 135–145)

## 2023-09-25 MED ORDER — POTASSIUM CHLORIDE CRYS ER 20 MEQ PO TBCR
40.0000 meq | EXTENDED_RELEASE_TABLET | Freq: Once | ORAL | Status: AC
  Administered 2023-09-25: 40 meq via ORAL
  Filled 2023-09-25: qty 2

## 2023-09-25 MED ORDER — TRAMADOL HCL 50 MG PO TABS
50.0000 mg | ORAL_TABLET | Freq: Four times a day (QID) | ORAL | 0 refills | Status: AC | PRN
  Filled 2023-09-25: qty 15, 4d supply, fill #0

## 2023-09-25 MED ORDER — AZITHROMYCIN 500 MG PO TABS
1000.0000 mg | ORAL_TABLET | Freq: Once | ORAL | Status: AC
  Administered 2023-09-25: 1000 mg via ORAL
  Filled 2023-09-25: qty 2

## 2023-09-25 NOTE — Progress Notes (Signed)
 Patient with discharge orders for today. Per [employer], no hold on patient.
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
## 2023-10-01 LAB — CDS SEROLOGY

## 2023-10-03 ENCOUNTER — Ambulatory Visit (HOSPITAL_COMMUNITY)
Admission: EM | Admit: 2023-10-03 | Discharge: 2023-10-03 | Disposition: A | Discharge status: Home / Self Care | Payer: Medicaid Other | Attending: Psychiatry | Admitting: Psychiatry

## 2023-10-03 DIAGNOSIS — F322 Major depressive disorder, single episode, severe without psychotic features: Secondary | ICD-10-CM | POA: Insufficient documentation
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
## 2023-10-03 NOTE — Discharge Instructions (Addendum)
Discharge recommendations:   Medications: Patient is to take medications as prescribed. The patient or patient's guardian is to contact a medical professional and/or outpatient provider to address any new side effects that develop. The patient or the patient's guardian should update outpatient providers of any new medications and/or medication changes.    Outpatient Follow up: Please review list of outpatient resources for psychiatry and counseling. Please follow up with your primary care provider for all medical related needs.    Therapy: We recommend that patient participate in individual therapy to address mental health concerns.   Atypical antipsychotics: If you are prescribed an atypical antipsychotic, it is recommended that your height, weight, BMI, blood pressure, fasting lipid panel, and fasting blood sugar be monitored by your outpatient providers.  Safety:   The following safety precautions should be taken:   No sharp objects. This includes scissors, razors, scrapers, and putty knives.   Chemicals should be removed and locked up.   Medications should be removed and locked up.   Weapons should be removed and locked up. This includes firearms, knives and instruments that can be used to cause injury.   The patient should abstain from use of illicit substances/drugs and abuse of any medications.  If symptoms worsen or do not continue to improve or if the patient becomes actively suicidal or homicidal then it is recommended that the patient return to the closest hospital emergency department, the [facility], or call 911 for further evaluation and treatment. National Suicide Prevention Lifeline 1-800-SUICIDE or 567-265-1791.  About 988 988 offers 24/7 access to trained crisis counselors who can help people experiencing mental health-related distress. People can call or text 988 or chat 988lifeline.org for themselves or if they are worried about a  loved one who may need crisis support.   Based on what you have shared, a list of resources for outpatient therapy and psychiatry is provided below to get you started back on treatment.  It is imperative that you follow through with treatment within 5-7 days from the day of discharge to prevent any further risk to your safety or mental well-being.  You are not limited to the list provided.  In case of an urgent crisis, you may contact the Mobile Crisis Unit with [facility] at 1.562-460-1106.        Outpatient Services for Therapy and Medication Management for [facility] [address], [place], [address] [phone] phone  New Patient Assessment/Therapy Walk-ins Monday and Wednesday: 8am until slots are full. Every 1st and 2nd Friday: 1pm - 5pm  NO ASSESSMENT/THERAPY WALK-INS ON TUESDAYS OR THURSDAYS  New Patient Psychiatry/Medication Management Walk-ins Monday-Friday: 8am-11am  For all walk-ins, we ask that you arrive by 7:30am because patient will be seen in the order of arrival.  Availability is limited; therefore, you may not be seen on the same day that you walk-in.  Our goal is to serve and meet the needs of our community to the best of our ability.   [facility] [address] [place], [place], [address] [phone] phone  [facility] [address] [place], [place], [address] [phone] phone [phone] phone ([address], Anthem/Elevance, [address], [address], [address], [address], Healthy [place], [place], [place], [address], [employer], [place], UHC, [employer], [place], Out of Network)  [employer], [place] [address] [place], [place], [address] [phone] phone (Monia Pouch, Anthem[employer]Elevance, [employer], [place],  [address], [place], [place], [place], [place], [employer],  [place], [place], [place], [facility])  [employer] 970 438 [address] [place], [place], [address] [phone] phone (Medicaid, ask about other insurance)  [facility] [address] [place], [place], [address] [phone] phone [phone] fax ([address] [place] , [place], [place], [place] Health Choice, UHC, General Electric, Self-Pay)  [facility] [address] [place], [place], [address] [phone] phone ([address] Anthem/Elevance, [address], [address], [place], [employer], [place], [place], [place], [employer], [place], [place], [place], [facility])  [facility] - 6-8 MONTH WAIT FOR THERAPY; SOONER FOR MEDICATION MANAGEMENT [address], [address] [place], [place], [address] [phone] phone ([address] AmeriHealth [address] - [place], [address], [place], [place], Friday Health Plans, [address], BCBS Healthy [place], [place], [address], [place], [place], [place], [place], Tricare, UHC, [employer], [place])  [facility] [address], [address] [place], [place], [address] [phone] phone  [facility] [address], [address] [place], [place], [address] [phone] phone  [facility] [address] [place], [place], [address] [phone] phone  [facility] [place] - THERAPY ONLY [address] [place], [address] [phone] phone  [facility], [place] [address], [place], [address] [phone] phone  [facility] [address] [place], [place], [address] [phone] phone [phone] fax  [facility] [address] [place], [place], [address] [phone] phone [phone] fax  [facility] [phone] [address] [place], [place], [address] [phone] phone  Jovita Kussmaul [address] [place], [place], [address]  [phone] phone  [facility]   (Adults Only) [address] [place], [place], [address]  [phone] phone [phone] fax

## 2023-10-03 NOTE — ED Notes (Signed)
Patient discharged home. AVS reviewed and all questions answered fully. Belongings returned complete and intact. Patient and guest escorted to lobby via staff. Safety maintained.
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
## 2023-10-07 ENCOUNTER — Ambulatory Visit: Payer: Medicaid Other | Admitting: Orthopaedic Surgery

## 2024-03-30 ENCOUNTER — Emergency Department (HOSPITAL_BASED_OUTPATIENT_CLINIC_OR_DEPARTMENT_OTHER)
Admission: EM | Admit: 2024-03-30 | Discharge: 2024-03-30 | Disposition: A | Discharge status: Home / Self Care | Attending: Emergency Medicine | Admitting: Emergency Medicine

## 2024-03-30 ENCOUNTER — Other Ambulatory Visit: Payer: Self-pay

## 2024-03-30 ENCOUNTER — Emergency Department (HOSPITAL_BASED_OUTPATIENT_CLINIC_OR_DEPARTMENT_OTHER)

## 2024-03-30 ENCOUNTER — Encounter (HOSPITAL_BASED_OUTPATIENT_CLINIC_OR_DEPARTMENT_OTHER): Payer: Self-pay

## 2024-03-30 DIAGNOSIS — S80211A Abrasion, right knee, initial encounter: Secondary | ICD-10-CM | POA: Insufficient documentation

## 2024-03-30 DIAGNOSIS — Z23 Encounter for immunization: Secondary | ICD-10-CM | POA: Diagnosis not present

## 2024-03-30 DIAGNOSIS — T07XXXA Unspecified multiple injuries, initial encounter: Secondary | ICD-10-CM

## 2024-03-30 DIAGNOSIS — S50311A Abrasion of right elbow, initial encounter: Secondary | ICD-10-CM | POA: Diagnosis not present

## 2024-03-30 DIAGNOSIS — S61452A Open bite of left hand, initial encounter: Secondary | ICD-10-CM | POA: Diagnosis present

## 2024-03-30 DIAGNOSIS — S80212A Abrasion, left knee, initial encounter: Secondary | ICD-10-CM | POA: Insufficient documentation

## 2024-03-30 MED ORDER — AMOXICILLIN-POT CLAVULANATE 875-125 MG PO TABS: 1.0000 | ORAL_TABLET | Freq: Two times a day (BID) | ORAL | 0 refills | Status: AC

## 2024-03-30 MED ORDER — TETANUS-DIPHTH-ACELL PERTUSSIS 5-2.5-18.5 LF-MCG/0.5 IM SUSY
0.5000 mL | PREFILLED_SYRINGE | Freq: Once | INTRAMUSCULAR | Status: AC
  Administered 2024-03-30: 0.5 mL via INTRAMUSCULAR
  Filled 2024-03-30: qty 0.5

## 2024-03-30 MED ORDER — AMOXICILLIN-POT CLAVULANATE 875-125 MG PO TABS
1.0000 | ORAL_TABLET | Freq: Once | ORAL | Status: AC
  Administered 2024-03-30: 1 via ORAL
  Filled 2024-03-30: qty 1

## 2024-03-30 MED ORDER — ACETAMINOPHEN 500 MG PO TABS
1000.0000 mg | ORAL_TABLET | Freq: Once | ORAL | Status: AC
  Administered 2024-03-30: 1000 mg via ORAL
  Filled 2024-03-30: qty 2
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
## 2024-07-17 ENCOUNTER — Encounter: Payer: Self-pay | Admitting: Radiology
